# Patient Record
Sex: Female | Born: 1935 | Race: White | Hispanic: No | Smoking: Former smoker
Health system: Southern US, Community
[De-identification: ages and names within clinical notes are randomized; demographics above are authoritative.]

## PROBLEM LIST (undated history)

## (undated) DIAGNOSIS — G2581 Restless legs syndrome: Secondary | ICD-10-CM

## (undated) DIAGNOSIS — H3530 Unspecified macular degeneration: Secondary | ICD-10-CM

## (undated) DIAGNOSIS — E079 Disorder of thyroid, unspecified: Secondary | ICD-10-CM

## (undated) HISTORY — PX: APPENDECTOMY: SHX54

## (undated) HISTORY — PX: TONSILLECTOMY: SUR1361

## (undated) HISTORY — PX: ABDOMINAL HYSTERECTOMY: SHX81

## (undated) HISTORY — PX: THYROIDECTOMY: SHX17

---

## 2001-08-20 ENCOUNTER — Inpatient Hospital Stay (HOSPITAL_COMMUNITY): Admission: RE | Admit: 2001-08-20 | Discharge: 2001-08-21 | Payer: Self-pay | Admitting: Neurological Surgery

## 2001-08-20 ENCOUNTER — Encounter: Payer: Self-pay | Admitting: Neurological Surgery

## 2004-05-14 ENCOUNTER — Ambulatory Visit: Payer: Self-pay | Admitting: Ophthalmology

## 2004-08-27 ENCOUNTER — Ambulatory Visit: Payer: Self-pay | Admitting: Urology

## 2004-09-05 ENCOUNTER — Ambulatory Visit: Payer: Self-pay | Admitting: Urology

## 2004-09-13 ENCOUNTER — Ambulatory Visit: Payer: Self-pay | Admitting: Urology

## 2004-10-02 ENCOUNTER — Ambulatory Visit: Payer: Self-pay | Admitting: Internal Medicine

## 2005-10-24 ENCOUNTER — Ambulatory Visit: Payer: Self-pay | Admitting: Internal Medicine

## 2006-12-16 ENCOUNTER — Ambulatory Visit: Payer: Self-pay | Admitting: Internal Medicine

## 2007-03-18 ENCOUNTER — Ambulatory Visit: Payer: Self-pay | Admitting: Ophthalmology

## 2007-12-17 ENCOUNTER — Ambulatory Visit: Payer: Self-pay | Admitting: Internal Medicine

## 2008-06-22 ENCOUNTER — Ambulatory Visit: Payer: Self-pay | Admitting: Internal Medicine

## 2008-07-21 ENCOUNTER — Ambulatory Visit: Payer: Self-pay | Admitting: Urology

## 2008-07-22 ENCOUNTER — Ambulatory Visit: Payer: Self-pay | Admitting: Urology

## 2008-07-27 ENCOUNTER — Ambulatory Visit: Payer: Self-pay | Admitting: Urology

## 2008-08-01 ENCOUNTER — Ambulatory Visit: Payer: Self-pay | Admitting: Urology

## 2008-08-02 ENCOUNTER — Inpatient Hospital Stay: Payer: Self-pay | Admitting: Internal Medicine

## 2009-03-22 IMAGING — CT CT ABD-PELV W/O CM
1 of 2 series · 14 of 32 positions shown, 18 images · non-contrast
Comparison: none

REASON FOR EXAM: Nephrolithiasis
COMMENTS:

[Series 2: soft tissue · axial · 0.61mm/px · z∈[-0,+354]mm · 14 of 133 slices shown, 18 images]
[im 10/133  soft-tissue]
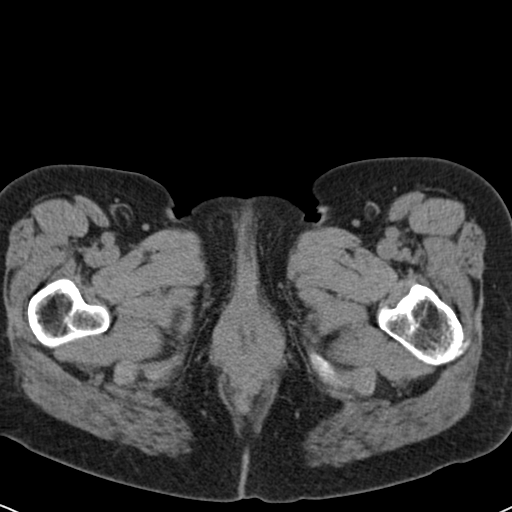
[im 10/133  bone]
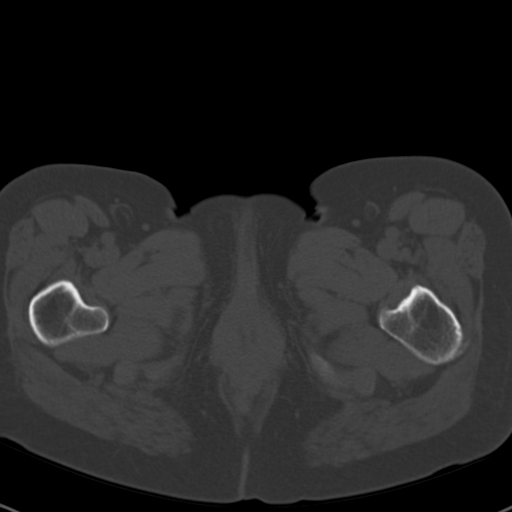
[im 20/133  soft-tissue]
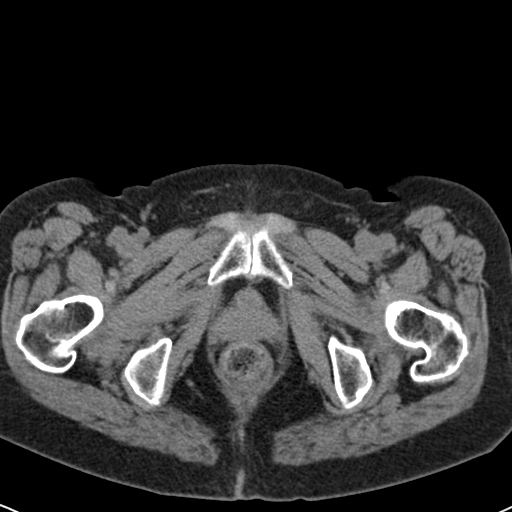
[im 30/133  soft-tissue]
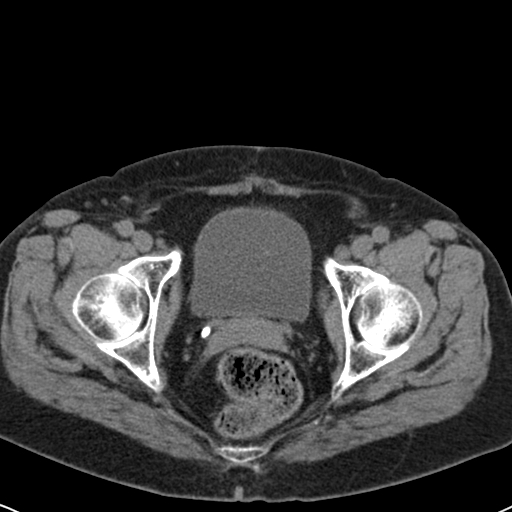
[im 40/133  soft-tissue]
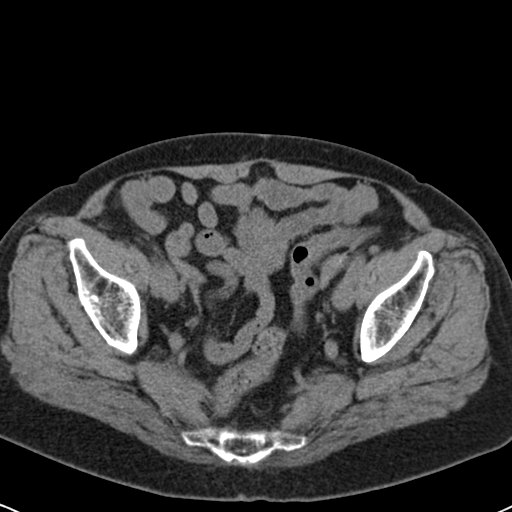
[im 49/133  soft-tissue]
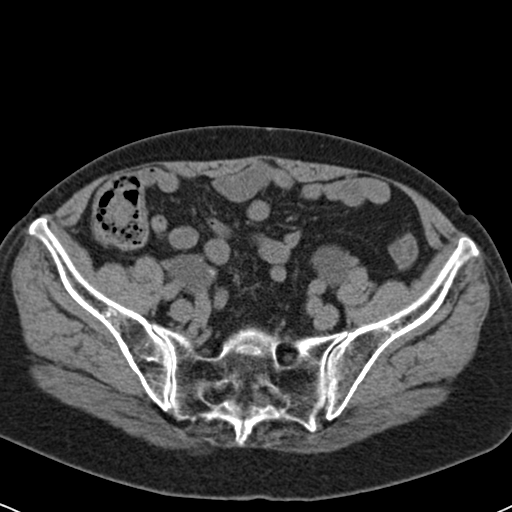
[im 59/133  soft-tissue]
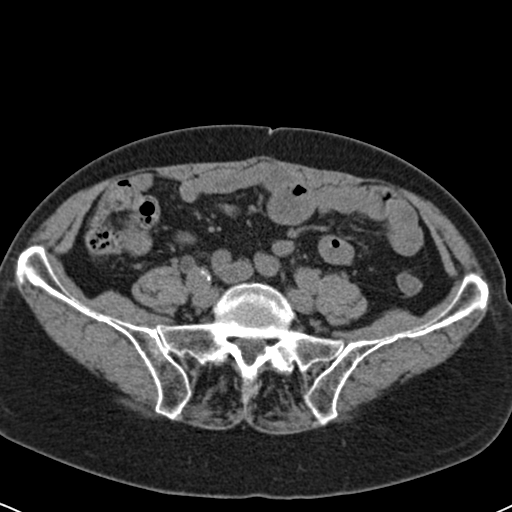
[im 74/133  soft-tissue]
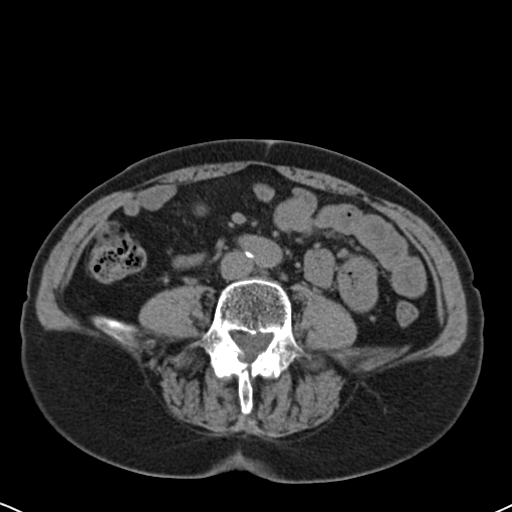
[im 84/133  soft-tissue]
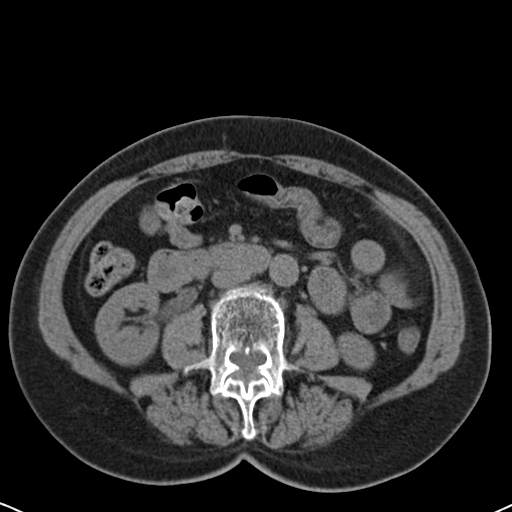
[im 93/133  soft-tissue]
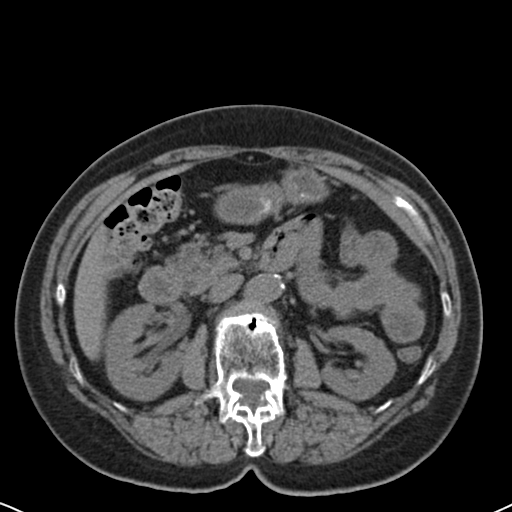
[im 93/133  bone]
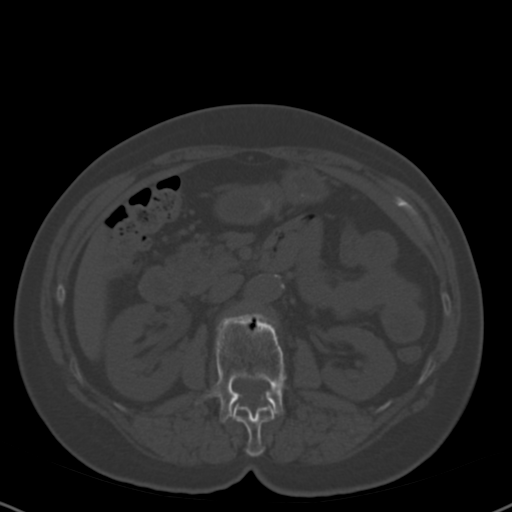
[im 103/133  soft-tissue]
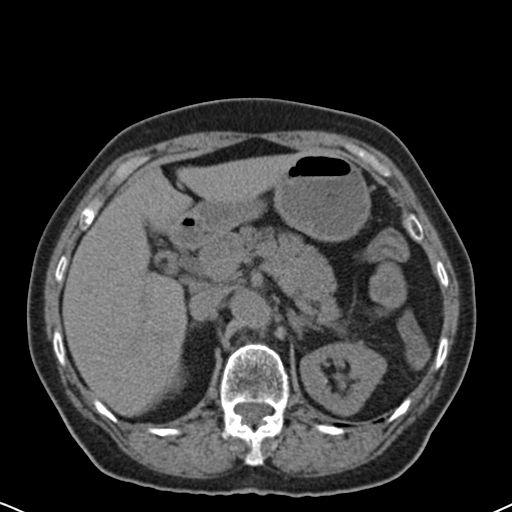
[im 113/133  soft-tissue]
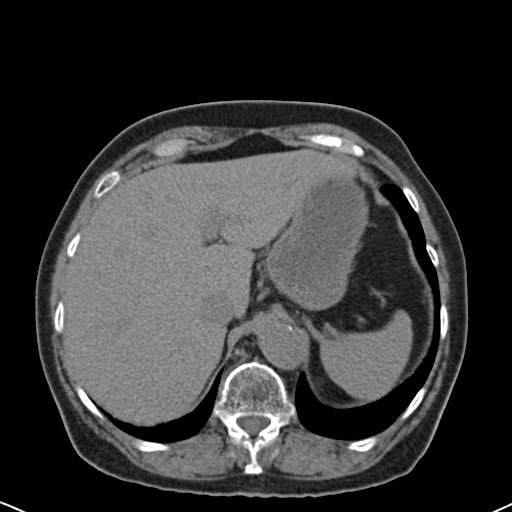
[im 113/133  lung]
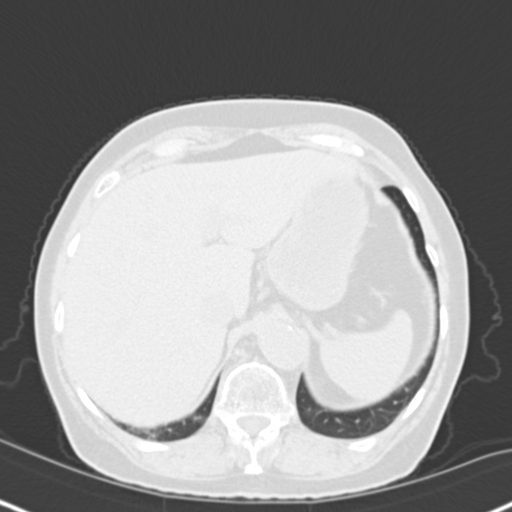
[im 118/133  lung]
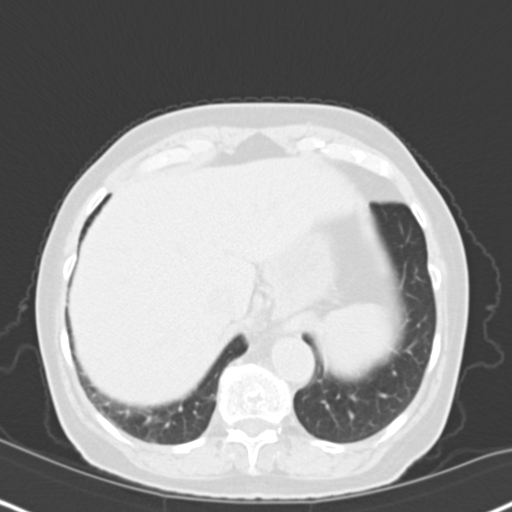
[im 123/133  soft-tissue]
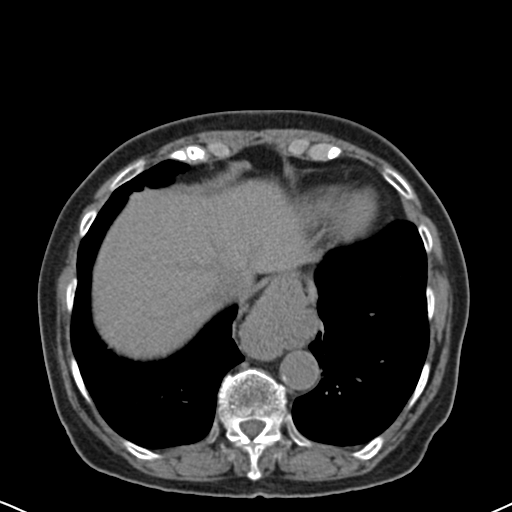
[im 123/133  lung]
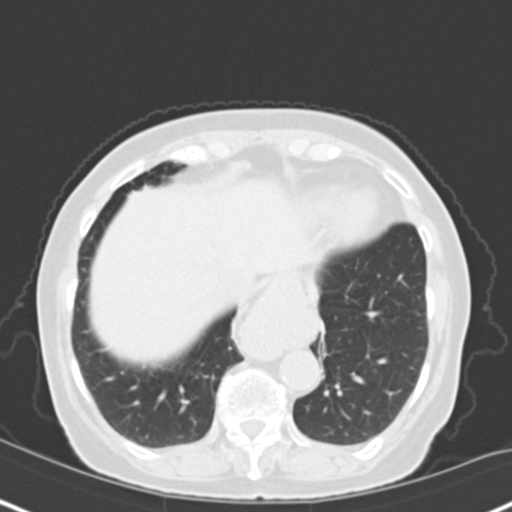
[im 128/133  lung]
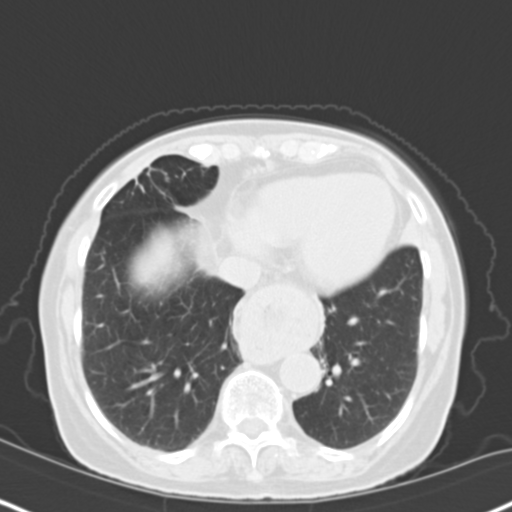

[14 of 32 positions shown; findings below may reference images not displayed]

PROCEDURE:     CT  - CT ABDOMEN AND PELVIS W[DATE]  [DATE]

RESULT:     A noncontrast CT scan was performed through the abdomen and
pelvis at 3 mm intervals and slice thicknesses in the axial plane. Soft
tissue and lung windows were photographed. Review of three dimensional
reconstructed images was performed separately on the [employer] server
monitor.

On the right, there is mild hydronephrosis and hydroureter secondary to a
stone that appears poised to fall into the urinary bladder lumen. This stone
measures approximately 3.0 x 5.0 mm and is demonstrated on image 107. There
is a non-obstructing approximately 2 mm in diameter midpole caliceal stone
on the right. On the left there is a non-obstructing, lower pole stone
measuring approximately 2.0 mm in diameter. There is no hydronephrosis on
the left. The urinary bladder exhibits no other stones. The uterus is
surgically absent. The sigmoid colon is normal in appearance. There are
cystic adnexal processes which are little changed in appearance since the CT
scan dated seen 08/27/2004. There has been interval growth of both cystic
structures such that the left cystic structure measures 3.4 x 3.2 cm and the
right cystic structure measures 2.7 x 3.5 cm. There is no inflammatory
change in the surrounding fat.

The liver, gallbladder, partially distended stomach, spleen, pancreas and
adrenal glands are normal in appearance. A portion of the stomach is
intrathoracic. The caliber of the abdominal aorta exhibits no evidence of
aneurysm. There is mild prominence of the caliber of the common iliac artery
on the right which measures approximately 1.6 cm in diameter. There is no
evidence of ascites. The unopacified loops of small and large bowel exhibit
no acute abnormality.
IMPRESSION: 1.  On the right there is mild hydronephrosis and hydroureter secondary to a
3.0 mm x 5.0 mm diameter stone that is poised to fall into the urinary
bladder lumen at the tip of the ureterovesical junction. There are other
non-obstructing stones measuring no more than 2.0 mm in diameter in both
kidneys.
2.  The hepatobiliary tree reveals no acute abnormality.
3.  There is partially intrathoracic stomach present.
4.  There are hypodensities in the adnexal regions bilaterally consistent
with cystic ovarian processes. These have increased in size since the prior
study. Pelvic Ultrasound would be a useful next step.

## 2009-06-27 ENCOUNTER — Ambulatory Visit: Payer: Self-pay | Admitting: Internal Medicine

## 2013-09-10 ENCOUNTER — Ambulatory Visit: Payer: Self-pay | Admitting: Gastroenterology

## 2013-10-12 ENCOUNTER — Ambulatory Visit: Payer: Self-pay | Admitting: Gastroenterology

## 2013-10-15 LAB — PATHOLOGY REPORT

## 2014-02-04 DIAGNOSIS — M5136 Other intervertebral disc degeneration, lumbar region: Secondary | ICD-10-CM | POA: Insufficient documentation

## 2014-02-04 DIAGNOSIS — M5416 Radiculopathy, lumbar region: Secondary | ICD-10-CM | POA: Insufficient documentation

## 2014-02-04 DIAGNOSIS — M51369 Other intervertebral disc degeneration, lumbar region without mention of lumbar back pain or lower extremity pain: Secondary | ICD-10-CM | POA: Insufficient documentation

## 2014-02-04 DIAGNOSIS — M47816 Spondylosis without myelopathy or radiculopathy, lumbar region: Secondary | ICD-10-CM | POA: Insufficient documentation

## 2014-02-05 DIAGNOSIS — M810 Age-related osteoporosis without current pathological fracture: Secondary | ICD-10-CM | POA: Insufficient documentation

## 2014-02-05 DIAGNOSIS — Z8659 Personal history of other mental and behavioral disorders: Secondary | ICD-10-CM | POA: Insufficient documentation

## 2014-02-05 DIAGNOSIS — M159 Polyosteoarthritis, unspecified: Secondary | ICD-10-CM | POA: Insufficient documentation

## 2014-02-05 DIAGNOSIS — G2581 Restless legs syndrome: Secondary | ICD-10-CM | POA: Insufficient documentation

## 2014-02-05 DIAGNOSIS — G4701 Insomnia due to medical condition: Secondary | ICD-10-CM | POA: Insufficient documentation

## 2014-02-05 DIAGNOSIS — G8929 Other chronic pain: Secondary | ICD-10-CM | POA: Insufficient documentation

## 2014-02-05 DIAGNOSIS — E039 Hypothyroidism, unspecified: Secondary | ICD-10-CM | POA: Insufficient documentation

## 2014-02-16 DIAGNOSIS — E7800 Pure hypercholesterolemia, unspecified: Secondary | ICD-10-CM | POA: Insufficient documentation

## 2016-01-25 DIAGNOSIS — I700 Atherosclerosis of aorta: Secondary | ICD-10-CM | POA: Insufficient documentation

## 2016-03-12 DIAGNOSIS — I5032 Chronic diastolic (congestive) heart failure: Secondary | ICD-10-CM | POA: Insufficient documentation

## 2016-04-11 ENCOUNTER — Encounter: Payer: Self-pay | Admitting: Emergency Medicine

## 2016-04-11 ENCOUNTER — Emergency Department
Admission: EM | Admit: 2016-04-11 | Discharge: 2016-04-11 | Disposition: A | Discharge status: Home / Self Care | Payer: Medicare Other | Attending: Emergency Medicine | Admitting: Emergency Medicine

## 2016-04-11 ENCOUNTER — Emergency Department: Payer: Medicare Other

## 2016-04-11 DIAGNOSIS — R42 Dizziness and giddiness: Secondary | ICD-10-CM

## 2016-04-11 DIAGNOSIS — Z79899 Other long term (current) drug therapy: Secondary | ICD-10-CM | POA: Diagnosis not present

## 2016-04-11 DIAGNOSIS — N390 Urinary tract infection, site not specified: Secondary | ICD-10-CM | POA: Insufficient documentation

## 2016-04-11 DIAGNOSIS — E876 Hypokalemia: Secondary | ICD-10-CM

## 2016-04-11 DIAGNOSIS — Z87891 Personal history of nicotine dependence: Secondary | ICD-10-CM | POA: Insufficient documentation

## 2016-04-11 DIAGNOSIS — R110 Nausea: Secondary | ICD-10-CM

## 2016-04-11 HISTORY — DX: Unspecified macular degeneration: H35.30

## 2016-04-11 HISTORY — DX: Disorder of thyroid, unspecified: E07.9

## 2016-04-11 HISTORY — DX: Restless legs syndrome: G25.81

## 2016-04-11 LAB — CBC
HEMATOCRIT: 37 % (ref 35.0–47.0)
HEMOGLOBIN: 12.2 g/dL (ref 12.0–16.0)
MCH: 26.9 pg (ref 26.0–34.0)
MCHC: 33.1 g/dL (ref 32.0–36.0)
MCV: 81.4 fL (ref 80.0–100.0)
Platelets: 290 10*3/uL (ref 150–440)
RBC: 4.55 MIL/uL (ref 3.80–5.20)
RDW: 15.7 % — AB (ref 11.5–14.5)
WBC: 7.8 10*3/uL (ref 3.6–11.0)

## 2016-04-11 LAB — BASIC METABOLIC PANEL
ANION GAP: 10 (ref 5–15)
BUN: 9 mg/dL (ref 6–20)
CHLORIDE: 103 mmol/L (ref 101–111)
CO2: 26 mmol/L (ref 22–32)
Calcium: 9.1 mg/dL (ref 8.9–10.3)
Creatinine, Ser: 0.79 mg/dL (ref 0.44–1.00)
GFR calc Af Amer: >60 mL/min (ref >60)
GFR calc non Af Amer: >60 mL/min (ref >60)
GLUCOSE: 107 mg/dL — AB (ref 65–99)
POTASSIUM: 3.4 mmol/L — AB (ref 3.5–5.1)
Sodium: 139 mmol/L (ref 135–145)

## 2016-04-11 LAB — URINALYSIS COMPLETE WITH MICROSCOPIC (ARMC ONLY)
BILIRUBIN URINE: NEGATIVE
GLUCOSE, UA: NEGATIVE mg/dL
HGB URINE DIPSTICK: NEGATIVE
Ketones, ur: NEGATIVE mg/dL
NITRITE: NEGATIVE
Protein, ur: NEGATIVE mg/dL
Specific Gravity, Urine: 1.008 (ref 1.005–1.030)
pH: 7 (ref 5.0–8.0)

## 2016-04-11 LAB — TROPONIN I

## 2016-04-11 MED ORDER — SULFAMETHOXAZOLE-TRIMETHOPRIM 800-160 MG PO TABS: 1.0000 | ORAL_TABLET | Freq: Two times a day (BID) | ORAL | 0 refills | Status: DC

## 2016-04-11 MED ORDER — IOPAMIDOL (ISOVUE-370) INJECTION 76%
75.0000 mL | Freq: Once | INTRAVENOUS | Status: AC | PRN
  Administered 2016-04-11: 75 mL via INTRAVENOUS

## 2016-04-11 MED ORDER — SULFAMETHOXAZOLE-TRIMETHOPRIM 800-160 MG PO TABS
1.0000 | ORAL_TABLET | Freq: Once | ORAL | Status: AC
  Administered 2016-04-11: 1 via ORAL
  Filled 2016-04-11: qty 1

## 2016-04-11 MED ORDER — POTASSIUM CHLORIDE CRYS ER 20 MEQ PO TBCR
40.0000 meq | EXTENDED_RELEASE_TABLET | Freq: Once | ORAL | Status: AC
  Administered 2016-04-11: 40 meq via ORAL
  Filled 2016-04-11: qty 2

## 2016-04-11 NOTE — Discharge Instructions (Signed)
Please stop taking: Tramadol, Lorazepam, and Torsemide.  Make an appointment with her primary care physician to discuss medication management of your ankle swelling and restless leg syndrome. Please discuss the side effects that you have been having with your doctor.  Return to the emergency department if he develops severe pain, headache, visual changes, numbness tingling or weakness, lightheadedness or fainting, chest pain, shortness of breath, palpitations, or any other symptoms concerning to you.

## 2016-04-11 NOTE — ED Notes (Signed)
Patient transported to CT 

## 2016-04-11 NOTE — ED Triage Notes (Signed)
Pt c/o dizziness and feeling like going to pass out for last 5 weeks since dr started new medications. Put her on lorazepam and a diuretic. Has had some SHOB as well. Denies pain.

## 2016-04-11 NOTE — ED Provider Notes (Signed)
[facility] Emergency Department Provider Note  ____________________________________________  Time seen: Approximately 7:30 PM  I have reviewed the triage vital signs and the nursing notes.   HISTORY  Chief Complaint Dizziness    HPI Julie Rangel is a 82 y.o. female w/ a hx of macular degeneration and restless leg syndrome presenting w/ lightheadedness, imbalance with walking, and nausea without vomiting. The patient reports that several weeks ago she was having bilateral lower extremity swelling, and was started on Lasix. She did not tolerate this medication, and was switched to torsemide. At the same time, the patient was having significant difficulty sleeping and during the day due to restless leg syndrome, and was placed on lorazepam 0.5mg  TID in addition to her chronic tramadol.Since then, the patient notices that when she stands she becomes lightheaded, and sometimes has difficulty walking due to imbalance. In addition she has nausea without vomiting. She was noted by her family to have increased sleepiness, sleeping throughout the day, as well as occasional slurred speech.  No headache, visual changes, hallucinations, nausea vomiting or diarrhea, numbness tingling or weakness.  Positive urinary "hotness" today only. She has been evaluated by her PCP for these symptoms, and reports a unremarkable echocardiogram for age, as well as reassuring blood work and negative UA x 2.   Past Medical History:  Diagnosis Date  . Macular degeneration   . RLS (restless legs syndrome)   . Thyroid disease     There are no active problems to display for this patient.   Past Surgical History:  Procedure Laterality Date  . ABDOMINAL HYSTERECTOMY    . APPENDECTOMY    . CESAREAN SECTION    . THYROIDECTOMY    . TONSILLECTOMY        Allergies Penicillins  History reviewed. No pertinent family history.  Social History Social History  Substance Use Topics  .  Smoking status: Former Games developer  . Smokeless tobacco: Never Used  . Alcohol use No    Review of Systems Constitutional: No fever/chills.Positive lightheadedness. No syncope. No diaphoresis. No trauma. Positive polypharmacy. Eyes: No visual changes. No blurred or double vision. ENT: No sore throat. No congestion or rhinorrhea. Cardiovascular: Denies chest pain. Denies palpitations. Respiratory: Denies shortness of breath.  No cough. Gastrointestinal: No abdominal pain.  No nausea, no vomiting.  No diarrhea.  No constipation. Genitourinary: Negative for dysuria. Musculoskeletal: Negative for back pain. Skin: Negative for rash. Neurological: Negative for headaches. No focal numbness, tingling or weakness. + Imbalance with walking.  10-point ROS otherwise negative.  ____________________________________________   PHYSICAL EXAM:  VITAL SIGNS: ED Triage Vitals  Enc Vitals Group     BP 04/11/16 1647 112/81     Pulse Rate 04/11/16 1647 64     Resp 04/11/16 1647 17     Temp 04/11/16 1647 97.9 F (36.6 C)     Temp Source 04/11/16 1647 Oral     SpO2 04/11/16 1647 99 %     Weight 04/11/16 1648 120 lb (54.4 kg)     Height 04/11/16 1648 5\' 3"  (1.6 m)     Head Circumference --      Peak Flow --      Pain Score --      Pain Loc --      Pain Edu? --      Excl. in GC? --     Constitutional: Alert and oriented. Well appearing and in no acute distress. Answers questions appropriately. Eyes: Conjunctivae are normal.  EOMI. No  scleral icterus. Head: Atraumatic. Nose: No congestion/rhinnorhea. Mouth/Throat: Mucous membranes are moist.  Neck: No stridor.  Supple.  No JVD. Cardiovascular: Normal rate, regular rhythm. No murmurs, rubs or gallops.  Respiratory: Normal respiratory effort.  No accessory muscle use or retractions. Lungs CTAB.  No wheezes, rales or ronchi. Gastrointestinal: Soft, nontender and nondistended.  No guarding or rebound.  No peritoneal signs. Musculoskeletal: No LE  edema. No ttp in the calves or palpable cords.  Negative Homan's sign. Neurologic: Alert and oriented 3. Speech is clear. Naming and repetition are intact. Face and smile symmetric. Tongue is midline. EOMI and PERRLA.  No pronator drift. 5 out of 5 grip, biceps, triceps, hip flexors, plantar flexion and dorsiflexion. Normal sensation to light touch in the bilateral upper and lower extremities, and face. Normal heel-to-shin without ataxia.  Skin:  Skin is warm, dry and intact. No rash noted. Psychiatric: Mood and affect are normal. Speech and behavior are normal.  Normal judgement  ____________________________________________   LABS (all labs ordered are listed, but only abnormal results are displayed)  Labs Reviewed  BASIC METABOLIC PANEL - Abnormal; Notable for the following:       Result Value   Potassium 3.4 (*)    Glucose, Bld 107 (*)    All other components within normal limits  CBC - Abnormal; Notable for the following:    RDW 15.7 (*)    All other components within normal limits  URINALYSIS COMPLETEWITH MICROSCOPIC ([facility] ONLY) - Abnormal; Notable for the following:    Color, Urine YELLOW (*)    APPearance CLEAR (*)    Leukocytes, UA 1+ (*)    Bacteria, UA RARE (*)    Squamous Epithelial / LPF 0-5 (*)    All other components within normal limits  URINE CULTURE  TROPONIN I  CBG MONITORING, ED   ____________________________________________  EKG  ED ECG REPORT I, Rockne MenghiniNorman, Anne-Caroline, the attending physician, personally viewed and interpreted this ECG.   Date: 04/11/2016  EKG Time: 1700  Rate: 65  Rhythm: normal sinus rhythm  Axis: Normal  Intervals:none  ST&T Change: Nonspecific T-wave inversion in V1. No ST elevation.  ____________________________________________  RADIOLOGY  Ct Angio Head W Or Wo Contrast  Result Date: 04/11/2016 CLINICAL DATA:  82 y/o F; dizziness and feeling like they are going to pass out for 5 weeks and starting a new medication. EXAM:  CT ANGIOGRAPHY HEAD AND NECK TECHNIQUE: Multidetector CT imaging of the head and neck was performed using the standard protocol during bolus administration of intravenous contrast. Multiplanar CT image reconstructions and MIPs were obtained to evaluate the vascular anatomy. Carotid stenosis measurements (when applicable) are obtained utilizing NASCET criteria, using the distal internal carotid diameter as the denominator. CONTRAST:  75 cc Isovue 370. COMPARISON:  None. FINDINGS: CT HEAD Brain: No evidence of large acute infarct, focal mass effect, intracranial hemorrhage, or hydrocephalus. Mild chronic microvascular ischemic changes and parenchymal volume loss for age. Moderate calcific atherosclerosis of cavernous internal carotid arteries. No hyperdense vessel identified. Calvarium and skull base: Negative. Paranasal sinuses: Normally aerated. Orbits: Unremarkable. CTA NECK Aortic arch: Mild calcific atherosclerosis of the arch. Right carotid system: Mild calcifications at the bifurcation. Brief retropharyngeal submucosal course of proximal internal carotid artery. No occlusion, aneurysm, dissection, or significant stenosis is identified. Left carotid system: Mild calcifications of the bifurcation. No occlusion, aneurysm, dissection, or significant stenosis is identified. Vertebral arteries:No occlusion, aneurysm, dissection, or significant stenosis is identified. Left dominant vertebrobasilar system. Skeleton: Straightening of cervical lordosis. Discogenic  degenerative changes greatest at Celsius 4 through C6 with there is disc space narrowing and marginal osteophytes. No high-grade bony canal stenosis or foraminal narrowing is identified. Other neck: No appreciable thyroid gland, possible post resection or ablation. No lymphadenopathy or discrete cervical mass in the neck is identified. Inflammatory calcifications of left palatini tonsil. Aerodigestive tract is patent. Upper chest: Moderate to severe  centrilobular predominant emphysema in the lung apices. 4 mm right upper lobe nodule (series 4, image 8). CTA HEAD Anterior circulation: Mild mixed plaque of the carotid siphons bilaterally without significant stenosis. 2 mm inferiorly and medially directed aneurysm of the left cavernous segment (series 6, image 102 and series 8, image 121). Bilateral M1 and the 2 are patent and distal circulations symmetric. Small anterior communicating artery. Posterior circulation: Bilateral vertebral arteries, bilateral PICA, bilateral AICA, bilateral SCA, bilateral PCA, and the basilar artery are patent. No occlusion, aneurysm, dissection, or significant stenosis is identified. Venous sinuses: Right transverse sinus prominent arachnoid granulation. No dural venous sinus thrombosis identified. Anatomic variants: Small right posterior communicating artery. No left posterior communicating artery identified, likely hypoplastic or absent. Delayed phase: No abnormal enhancement of the brain parenchyma is identified. IMPRESSION: 1. No acute intracranial abnormality or abnormal enhancement is identified. 2. Carotid and vertebral arteries of the neck are patent. No occlusion, aneurysm, dissection, or significant stenosis is identified. 3. Patent circle of Willis. No occlusion, dissection, or significant stenosis is identified. 4. 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 5. Moderate to severe lung apex emphysema. 6. Right upper lobe 4 mm pulmonary nodule. No follow-up needed if patient is low-risk. Non-contrast chest CT can be considered in 12 months if patient is high-risk. This recommendation follows the consensus statement: Guidelines for Management of Incidental Pulmonary Nodules Detected on CT Images:From the Fleischner Society 2017; published online before print (10.1148/[id number]). Electronically Signed   By: Mitzi Hansen M.D.   On: 04/11/2016 20:34   Dg Chest 2 View  Result  Date: 04/11/2016 CLINICAL DATA:  Feeling unwell for 5 weeks, now worsening. History of bronchitis, former smoker. EXAM: CHEST  2 VIEW COMPARISON:  Chest radiograph September 10, 2013 FINDINGS: Cardiomediastinal silhouette is normal. Mildly calcified aortic knob. Increased lung volumes with mild chronic interstitial changes. No pleural effusion or focal consolidation. No pneumothorax. Moderate hiatal hernia. Osteopenia. Soft tissue planes are nonsuspicious. IMPRESSION: COPD, no acute cardiopulmonary process. Moderate hiatal hernia. Electronically Signed   By: Awilda Metro M.D.   On: 04/11/2016 18:01   Ct Angio Neck W And/or Wo Contrast  Result Date: 04/11/2016 CLINICAL DATA:  82 y/o F; dizziness and feeling like they are going to pass out for 5 weeks and starting a new medication. EXAM: CT ANGIOGRAPHY HEAD AND NECK TECHNIQUE: Multidetector CT imaging of the head and neck was performed using the standard protocol during bolus administration of intravenous contrast. Multiplanar CT image reconstructions and MIPs were obtained to evaluate the vascular anatomy. Carotid stenosis measurements (when applicable) are obtained utilizing NASCET criteria, using the distal internal carotid diameter as the denominator. CONTRAST:  75 cc Isovue 370. COMPARISON:  None. FINDINGS: CT HEAD Brain: No evidence of large acute infarct, focal mass effect, intracranial hemorrhage, or hydrocephalus. Mild chronic microvascular ischemic changes and parenchymal volume loss for age. Moderate calcific atherosclerosis of cavernous internal carotid arteries. No hyperdense vessel identified. Calvarium and skull base: Negative. Paranasal sinuses: Normally aerated. Orbits: Unremarkable. CTA NECK Aortic arch: Mild calcific atherosclerosis of the arch. Right carotid system: Mild calcifications at  the bifurcation. Brief retropharyngeal submucosal course of proximal internal carotid artery. No occlusion, aneurysm, dissection, or significant stenosis  is identified. Left carotid system: Mild calcifications of the bifurcation. No occlusion, aneurysm, dissection, or significant stenosis is identified. Vertebral arteries:No occlusion, aneurysm, dissection, or significant stenosis is identified. Left dominant vertebrobasilar system. Skeleton: Straightening of cervical lordosis. Discogenic degenerative changes greatest at Celsius 4 through C6 with there is disc space narrowing and marginal osteophytes. No high-grade bony canal stenosis or foraminal narrowing is identified. Other neck: No appreciable thyroid gland, possible post resection or ablation. No lymphadenopathy or discrete cervical mass in the neck is identified. Inflammatory calcifications of left palatini tonsil. Aerodigestive tract is patent. Upper chest: Moderate to severe centrilobular predominant emphysema in the lung apices. 4 mm right upper lobe nodule (series 4, image 8). CTA HEAD Anterior circulation: Mild mixed plaque of the carotid siphons bilaterally without significant stenosis. 2 mm inferiorly and medially directed aneurysm of the left cavernous segment (series 6, image 102 and series 8, image 121). Bilateral M1 and the 2 are patent and distal circulations symmetric. Small anterior communicating artery. Posterior circulation: Bilateral vertebral arteries, bilateral PICA, bilateral AICA, bilateral SCA, bilateral PCA, and the basilar artery are patent. No occlusion, aneurysm, dissection, or significant stenosis is identified. Venous sinuses: Right transverse sinus prominent arachnoid granulation. No dural venous sinus thrombosis identified. Anatomic variants: Small right posterior communicating artery. No left posterior communicating artery identified, likely hypoplastic or absent. Delayed phase: No abnormal enhancement of the brain parenchyma is identified. IMPRESSION: 1. No acute intracranial abnormality or abnormal enhancement is identified. 2. Carotid and vertebral arteries of the neck are  patent. No occlusion, aneurysm, dissection, or significant stenosis is identified. 3. Patent circle of Willis. No occlusion, dissection, or significant stenosis is identified. 4. 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 5. Moderate to severe lung apex emphysema. 6. Right upper lobe 4 mm pulmonary nodule. No follow-up needed if patient is low-risk. Non-contrast chest CT can be considered in 12 months if patient is high-risk. This recommendation follows the consensus statement: Guidelines for Management of Incidental Pulmonary Nodules Detected on CT Images:From the Fleischner Society 2017; published online before print (10.1148/[id number]). Electronically Signed   By: Mitzi Hansen M.D.   On: 04/11/2016 20:34    ____________________________________________   PROCEDURES  Procedure(s) performed: None  Procedures  Critical Care performed: No ____________________________________________   INITIAL IMPRESSION / ASSESSMENT AND PLAN / ED COURSE  Pertinent labs & imaging results that were available during my care of the patient were reviewed by me and considered in my medical decision making (see chart for details).  82 y.o. female with recent addition of torsemide, lorazepam and tramadol to her medications presenting with postural lightheadedness, imbalance with walking, nausea without vomiting. Overall, the patient is well-appearing with stable vital signs and normal orthostatics. She has no focal neurologic deficits on exam. She has no abnormal cardio home findings on examination. It is possible that her symptoms are due to polypharmacy. Her urinalysis does show have some bacteria with leukocyte esterase, so we'll treat her for UTI although it is unlikely that her symptoms are solely due to a urinary tract infection. A culture has been sent. The patient's not anemic, and her electrolytes are reassuring. I will get a CT and CT angiogram of the head and  neck to rule out posterior stroke, but if this is negative, I'll plan to discharge the patient home with instructions to follow up with her primary care  physician for medication reconciliation, stopping the Lorazepam.  ----------------------------------------- 9:29 PM on 04/11/2016 -----------------------------------------  The patient's CT angio of the head and neck and CT scan are negative for any acute processes. The patient is feeling better and will be discharge home. Precautions as well as follow-up instructions were discussed. ____________________________________________  FINAL CLINICAL IMPRESSION(S) / ED DIAGNOSES  Final diagnoses:  Hypokalemia  Lightheadedness  Nausea without vomiting  Polypharmacy  UTI (lower urinary tract infection)    Clinical Course      NEW MEDICATIONS STARTED DURING THIS VISIT:  There are no discharge medications for this patient.     Rockne Menghini, MD 04/11/16 2129

## 2016-04-13 LAB — URINE CULTURE: Culture: NO GROWTH

## 2016-05-23 ENCOUNTER — Encounter: Payer: Self-pay | Admitting: Pain Medicine

## 2016-05-23 ENCOUNTER — Ambulatory Visit
Admission: RE | Admit: 2016-05-23 | Discharge: 2016-05-23 | Disposition: A | Discharge status: Home / Self Care | Payer: Medicare Other | Source: Ambulatory Visit | Attending: Pain Medicine | Admitting: Pain Medicine

## 2016-05-23 ENCOUNTER — Other Ambulatory Visit
Admission: RE | Admit: 2016-05-23 | Discharge: 2016-05-23 | Disposition: A | Discharge status: Home / Self Care | Payer: Medicare Other | Source: Ambulatory Visit | Attending: Pain Medicine | Admitting: Pain Medicine

## 2016-05-23 ENCOUNTER — Ambulatory Visit: Payer: Medicare Other | Attending: Pain Medicine | Admitting: Pain Medicine

## 2016-05-23 VITALS — BP 156/75 | HR 74 | Temp 98.0°F | Ht 63.0 in | Wt 120.0 lb

## 2016-05-23 DIAGNOSIS — G8929 Other chronic pain: Secondary | ICD-10-CM

## 2016-05-23 DIAGNOSIS — Z880 Allergy status to penicillin: Secondary | ICD-10-CM | POA: Diagnosis not present

## 2016-05-23 DIAGNOSIS — M792 Neuralgia and neuritis, unspecified: Secondary | ICD-10-CM | POA: Insufficient documentation

## 2016-05-23 DIAGNOSIS — M4316 Spondylolisthesis, lumbar region: Secondary | ICD-10-CM | POA: Diagnosis not present

## 2016-05-23 DIAGNOSIS — M545 Low back pain: Secondary | ICD-10-CM | POA: Diagnosis not present

## 2016-05-23 DIAGNOSIS — M818 Other osteoporosis without current pathological fracture: Secondary | ICD-10-CM

## 2016-05-23 DIAGNOSIS — Z87440 Personal history of urinary (tract) infections: Secondary | ICD-10-CM | POA: Diagnosis not present

## 2016-05-23 DIAGNOSIS — M47894 Other spondylosis, thoracic region: Secondary | ICD-10-CM | POA: Insufficient documentation

## 2016-05-23 DIAGNOSIS — R1030 Lower abdominal pain, unspecified: Secondary | ICD-10-CM

## 2016-05-23 DIAGNOSIS — I5032 Chronic diastolic (congestive) heart failure: Secondary | ICD-10-CM

## 2016-05-23 DIAGNOSIS — Z87891 Personal history of nicotine dependence: Secondary | ICD-10-CM | POA: Insufficient documentation

## 2016-05-23 DIAGNOSIS — M5416 Radiculopathy, lumbar region: Secondary | ICD-10-CM

## 2016-05-23 DIAGNOSIS — H3530 Unspecified macular degeneration: Secondary | ICD-10-CM | POA: Insufficient documentation

## 2016-05-23 DIAGNOSIS — M961 Postlaminectomy syndrome, not elsewhere classified: Secondary | ICD-10-CM

## 2016-05-23 DIAGNOSIS — M810 Age-related osteoporosis without current pathological fracture: Secondary | ICD-10-CM | POA: Insufficient documentation

## 2016-05-23 DIAGNOSIS — E079 Disorder of thyroid, unspecified: Secondary | ICD-10-CM | POA: Insufficient documentation

## 2016-05-23 DIAGNOSIS — M79606 Pain in leg, unspecified: Secondary | ICD-10-CM

## 2016-05-23 DIAGNOSIS — M79604 Pain in right leg: Secondary | ICD-10-CM | POA: Diagnosis present

## 2016-05-23 DIAGNOSIS — Z888 Allergy status to other drugs, medicaments and biological substances status: Secondary | ICD-10-CM | POA: Diagnosis not present

## 2016-05-23 DIAGNOSIS — G2581 Restless legs syndrome: Secondary | ICD-10-CM | POA: Insufficient documentation

## 2016-05-23 DIAGNOSIS — M4726 Other spondylosis with radiculopathy, lumbar region: Secondary | ICD-10-CM | POA: Diagnosis not present

## 2016-05-23 DIAGNOSIS — M5136 Other intervertebral disc degeneration, lumbar region: Secondary | ICD-10-CM | POA: Insufficient documentation

## 2016-05-23 DIAGNOSIS — G4700 Insomnia, unspecified: Secondary | ICD-10-CM | POA: Diagnosis not present

## 2016-05-23 DIAGNOSIS — E039 Hypothyroidism, unspecified: Secondary | ICD-10-CM | POA: Insufficient documentation

## 2016-05-23 DIAGNOSIS — Z79899 Other long term (current) drug therapy: Secondary | ICD-10-CM | POA: Diagnosis not present

## 2016-05-23 DIAGNOSIS — R209 Unspecified disturbances of skin sensation: Secondary | ICD-10-CM

## 2016-05-23 DIAGNOSIS — Z79891 Long term (current) use of opiate analgesic: Secondary | ICD-10-CM | POA: Insufficient documentation

## 2016-05-23 DIAGNOSIS — M5116 Intervertebral disc disorders with radiculopathy, lumbar region: Secondary | ICD-10-CM | POA: Insufficient documentation

## 2016-05-23 DIAGNOSIS — E7800 Pure hypercholesterolemia, unspecified: Secondary | ICD-10-CM | POA: Diagnosis not present

## 2016-05-23 DIAGNOSIS — M79605 Pain in left leg: Secondary | ICD-10-CM

## 2016-05-23 DIAGNOSIS — Z0189 Encounter for other specified special examinations: Secondary | ICD-10-CM | POA: Insufficient documentation

## 2016-05-23 DIAGNOSIS — F1190 Opioid use, unspecified, uncomplicated: Secondary | ICD-10-CM | POA: Insufficient documentation

## 2016-05-23 LAB — BRAIN NATRIURETIC PEPTIDE: B NATRIURETIC PEPTIDE 5: 50 pg/mL (ref 0.0–100.0)

## 2016-05-23 LAB — COMPREHENSIVE METABOLIC PANEL
ALBUMIN: 3.9 g/dL (ref 3.5–5.0)
ALT: 15 U/L (ref 14–54)
ANION GAP: 11 (ref 5–15)
AST: 27 U/L (ref 15–41)
Alkaline Phosphatase: 99 U/L (ref 38–126)
BILIRUBIN TOTAL: 0.5 mg/dL (ref 0.3–1.2)
BUN: 16 mg/dL (ref 6–20)
CO2: 27 mmol/L (ref 22–32)
Calcium: 9.5 mg/dL (ref 8.9–10.3)
Chloride: 99 mmol/L — ABNORMAL LOW (ref 101–111)
Creatinine, Ser: 0.79 mg/dL (ref 0.44–1.00)
GFR calc Af Amer: >60 mL/min (ref >60)
GFR calc non Af Amer: >60 mL/min (ref >60)
GLUCOSE: 108 mg/dL — AB (ref 65–99)
POTASSIUM: 3.6 mmol/L (ref 3.5–5.1)
SODIUM: 137 mmol/L (ref 135–145)
TOTAL PROTEIN: 7.5 g/dL (ref 6.5–8.1)

## 2016-05-23 LAB — SEDIMENTATION RATE: SED RATE: 6 mm/h (ref 0–30)

## 2016-05-23 LAB — MAGNESIUM: Magnesium: 1.9 mg/dL (ref 1.7–2.4)

## 2016-05-23 LAB — VITAMIN B12: Vitamin B-12: 355 pg/mL (ref 180–914)

## 2016-05-23 NOTE — Progress Notes (Signed)
Safety precautions to be maintained throughout the outpatient stay will include: orient to surroundings, keep bed in low position, maintain call bell within reach at all times, provide assistance with transfer out of bed and ambulation.  

## 2016-05-23 NOTE — Progress Notes (Signed)
Patient's Name: Julie Rangel  MRN: [id number]  Referring Provider: Lauro Regulus, MD  DOB: 1922/01/30  PCP: Lauro Regulus., MD  DOS: 05/23/2016  Note by: Sydnee Levans. Laban Emperor, MD  Service setting: Ambulatory outpatient  Specialty: Interventional Pain Management  Location: [facility] (AMB) Pain Management Facility    Patient type: New Patient   Primary Reason(s) for Visit: Initial Patient Evaluation CC: Leg Pain (bilateral)  HPI  [name] is a 82 y.o. year old, female patient, who comes today for an initial evaluation. She has Hardening of the aorta (main artery of the heart) (HCC); Chronic diastolic CHF (congestive heart failure) (HCC); Degenerative disc disease, lumbar; Generalized OA; History of depression; Hypothyroid; Insomnia; Lumbar radiculitis; Lumbar spondylosis; Osteoporosis; Pure hypercholesterolemia; RLS (restless legs syndrome); Chronic pain; Failed back surgical syndrome; Chronic low back pain (Location of Secondary source of pain) (Bilateral) (L>R); Neurogenic pain; Chronic lower extremity pain (Location of Primary Source of Pain) (Bilateral) (L>R); Chronic groin pain (Bilateral) (L>R); Disturbance of skin sensation; Long term current use of opiate analgesic; Long term prescription opiate use; Opiate use; and Encounter for pain management planning on her problem list.. Her primarily concern today is the Leg Pain (bilateral)  Pain Assessment: Self-Reported Pain Score: 1 /10             Reported level is compatible with observation.       Pain Type: Chronic pain Pain Location: Leg (back) Pain Orientation: Left, Right (lower back) Pain Descriptors / Indicators: Burning (drawing) Pain Frequency: Intermittent  Onset and Duration: Gradual and Date of onset: 3 years ago Cause of pain: Restless leg syndrome Severity: Getting worse, NAS-11 at its worse: 10/10, NAS-11 at its best: 2/10, NAS-11 now: 3/10 and NAS-11 on the average: 6/10 Timing: Night Aggravating Factors:  Motion Alleviating Factors: Medications Associated Problems: Sweating, Swelling, Tingling, Pain that wakes patient up and Pain that does not allow patient to sleep Quality of Pain: Agonizing, Burning, Constant, Distressing, Dreadful, Getting longer, Hot, Itching, Shooting and Tingling Previous Examinations or Tests: X-rays Previous Treatments: Narcotic medications  The patient comes into the clinics today for the first time for a chronic pain management evaluation. According to the patient everything started approximately 27 years ago with restless leg syndrome. Several years ago she developed low back problems and lower extremity pain with weakness that was bilateral. She underwent back surgery in 2003 by Dr. Danielle Dess which completely eliminated this lower extremity pain and weakness. 2-4 years ago she started again having lower extremity pain approximately 2 years ago she was started on pain medications. She was seen by Dr. Yves Dill who put her on gabapentin. Unfortunately even though it did help her pain, it made her sedation very blurry and he was later found out that it was secondary to the effects of the medication on her macular degeneration. She experienced similar problems with the use of tramadol. They also attempted to treat it with lorazepam but this made her very loopy. Currently her primary symptoms are those of the lower extremity pain which is bilateral but with the left side being worst on the right. She has also been experiencing left groin electrical-like sensations that go down the leg primarily from the calf down to the bottom of her foot in what seems to be an S1 dermatomal distribution. In the case of her right lower extremity pain it follows a pattern identical to that of the left lower extremity. The patient's secondary pain is that of the lower back, which is also bilateral  with the left being worst on the right. She indicates having had some nerve blocks done by Dr. Yves Dill but these  are not available at this time. She was referred to our service by Dr. Dareen Piano. She indicates that taking hydrocodone/APAP 5/325 one every 6 hours seems to help her leg symptoms, especially when she combines it with the gabapentin. Unfortunately, they gabapentin does cause this blurred vision secondary to her macular degeneration.  Today I took the time to provide the patient with information regarding my pain practice. The patient was informed that my practice is divided into two sections: an interventional pain management section, as well as a completely separate and distinct medication management section. The interventional portion of my practice takes place on Tuesdays and Thursdays, while the medication management is conducted on Mondays and Wednesdays. Because of the amount of documentation required on both them, they are kept separated. This means that there is the possibility that the patient may be scheduled for a procedure on Tuesday, while also having a medication management appointment on Wednesday. I have also informed the patient that because of current staffing and facility limitations, I no longer take patients for medication management only. To illustrate the reasons for this, I gave the patient the example of a surgeon and how inappropriate it would be to refer a patient to his/her practice so that they write for the post-procedure antibiotics on a surgery done by someone else.   The patient was informed that joining my practice means that they are open to any and all interventional therapies. I clarified for the patient that this does not mean that they will be forced to have any procedures done. What it means is that patients looking for a practitioner to simply write for their pain medications and not take advantage of other interventional techniques will be better served by a different practitioner, other than myself. I made it clear that I prefer to spend my time providing those services  that I specialize in.  The patient was also made aware of my Comprehensive Pain Management Safety Guidelines where by joining my practice, they limit all of their nerve blocks and joint injections to those done by our practice, for as long as we are retained to manage their care.   Historic Controlled Substance Pharmacotherapy Review  Currently Prescribed Analgesic: Hydrocodone/APAP 5/325 one every 6 hours (20 mg/day of hydrocodone) MME/day: 20 mg/day Medications: The patient did not bring the medication(s) to the appointment, as requested in our "New Patient Package" Pharmacodynamics: Analgesic Effect: More than 50% Activity Facilitation: Medication(s) allow patient to sit, stand, walk, and do the basic ADLs Perceived Effectiveness: Described as relatively effective, allowing for increase in activities of daily living (ADL) Side-effects or Adverse reactions: None reported Historical Background Evaluation: [place] PDMP: Five (5) year initial data search conducted. No abnormal patterns identified [place] [employer] Offender Public Information: Non-contributory UDS Results: No UDS results available at this time UDS Interpretation: N/A Medication Assessment Form: Not applicable. Initial evaluation. The patient has not received any medications from our practice Treatment compliance: Not applicable. Initial evaluation Risk Assessment Profile: Aberrant/High Risk Behavior: None observed or detected today Risk Factors for Fatal Opioid Overdose: None identified today Fatal overdose hazard ratio (HR): Calculation deferred Non-fatal overdose hazard ratio (HR): Calculation deferred Substance Use Disorder (SUD) Risk Level: Pending results of Medical Psychology Evaluation for SUD Opioid Risk Tool (ORT) Score: 0   Low Risk for SUD (Score <3) Depression Scale Score: PHQ-2: 0  No depression (0) PHQ-9: 0   No depression (0-4)  Pharmacologic Plan: Pending ordered tests and/or  consults  Historical Illicit Drug Screen Labs(s): No results found for: MDMA, COCAINSCRNUR, PCPSCRNUR, THCU, ETH Meds  The patient has a current medication list which includes the following prescription(s): furosemide, gabapentin, hydrocodone-acetaminophen, levothyroxine, multiple vitamins-minerals, potassium chloride, premarin, senna, and torsemide.  No current outpatient prescriptions on file prior to visit.   No current facility-administered medications on file prior to visit.    Imaging Review   Note: No results found under the [facility] electronic medical record  ROS  Cardiovascular History: Negative for hypertension, coronary artery diseas, myocardial infraction, anticoagulant therapy or heart failure Pulmonary or Respiratory History: Shortness of breath Neurological History: Negative for epilepsy, stroke, urinary or fecal inontinence, spina bifida or tethered cord syndrome Review of Past Neurological Studies: No results found for this or any previous visit. Psychological-Psychiatric History: Negative for anxiety, depression, schizophrenia, bipolar disorders or suicidal ideations or attempts Gastrointestinal History: Negative for peptic ulcer disease, hiatal hernia, GERD, IBS, hepatitis, cirrhosis or pancreatitis Genitourinary History: Recurrent Urinary Tract infections Hematological History: Brusing easily Endocrine History: Hypothyroidism Rheumatologic History: Osteoarthritis Musculoskeletal History: Negative for myasthenia gravis, muscular dystrophy, multiple sclerosis or malignant hyperthermia Work History: Retired  Allergies  Ms. Lynn is allergic to lorazepam; penicillin g; penicillins; risedronate; and ropinirole.  Laboratory Chemistry  Inflammation Markers Lab Results  Component Value Date   ESRSEDRATE 6 05/23/2016   Renal Function Lab Results  Component Value Date   BUN 16 05/23/2016   CREATININE 0.79 05/23/2016   GFRAA >60 05/23/2016   GFRNONAA >60  05/23/2016   Hepatic Function Lab Results  Component Value Date   AST 27 05/23/2016   ALT 15 05/23/2016   ALBUMIN 3.9 05/23/2016   Electrolytes Lab Results  Component Value Date   NA 137 05/23/2016   K 3.6 05/23/2016   CL 99 (L) 05/23/2016   CALCIUM 9.5 05/23/2016   MG 1.9 05/23/2016   Pain Modulating Vitamins Lab Results  Component Value Date   25OHVITD1 35 05/23/2016   25OHVITD2 1.1 05/23/2016   25OHVITD3 34 05/23/2016   VITAMINB12 355 05/23/2016   Coagulation Parameters Lab Results  Component Value Date   PLT 290 04/11/2016   Cardiovascular Lab Results  Component Value Date   BNP 50.0 05/23/2016   HGB 12.2 04/11/2016   HCT 37.0 04/11/2016   Note: Lab results reviewed.  PFSH  Drug: Ms. Schoenbeck  reports that she does not use drugs. Alcohol:  reports that she does not drink alcohol. Tobacco:  reports that she has quit smoking. She has never used smokeless tobacco. Medical:  has a past medical history of Macular degeneration; RLS (restless legs syndrome); and Thyroid disease. Family: family history is not on file.  Past Surgical History:  Procedure Laterality Date  . ABDOMINAL HYSTERECTOMY    . APPENDECTOMY    . CESAREAN SECTION    . THYROIDECTOMY    . TONSILLECTOMY     Active Ambulatory Problems    Diagnosis Date Noted  . Hardening of the aorta (main artery of the heart) (HCC) 01/25/2016  . Chronic diastolic CHF (congestive heart failure) (HCC) 03/12/2016  . Degenerative disc disease, lumbar 02/04/2014  . Generalized OA 02/05/2014  . History of depression 02/05/2014  . Hypothyroid 02/05/2014  . Insomnia 02/05/2014  . Lumbar radiculitis 02/04/2014  . Lumbar spondylosis 02/04/2014  . Osteoporosis 02/05/2014  . Pure hypercholesterolemia 02/16/2014  . RLS (restless legs syndrome) 02/05/2014  . Chronic pain  05/23/2016  . Failed back surgical syndrome 05/23/2016  . Chronic low back pain (Location of Secondary source of pain) (Bilateral) (L>R)  05/23/2016  . Neurogenic pain 05/23/2016  . Chronic lower extremity pain (Location of Primary Source of Pain) (Bilateral) (L>R) 05/23/2016  . Chronic groin pain (Bilateral) (L>R) 05/23/2016  . Disturbance of skin sensation 05/23/2016  . Long term current use of opiate analgesic 05/23/2016  . Long term prescription opiate use 05/23/2016  . Opiate use 05/23/2016  . Encounter for pain management planning 05/23/2016   Resolved Ambulatory Problems    Diagnosis Date Noted  . No Resolved Ambulatory Problems   Past Medical History:  Diagnosis Date  . Macular degeneration   . RLS (restless legs syndrome)   . Thyroid disease    Constitutional Exam  General appearance: Well nourished, well developed, and well hydrated. In no apparent acute distress Vitals:   05/23/16 0758  BP: (!) 156/75  Pulse: 74  Temp: 98 F (36.7 C)  SpO2: 100%  Weight: 120 lb (54.4 kg)  Height: 5\' 3"  (1.6 m)  BMI Assessment: Estimated body mass index is 21.26 kg/m as calculated from the following:   Height as of this encounter: 5\' 3"  (1.6 m).   Weight as of this encounter: 120 lb (54.4 kg).   BMI interpretation:           BMI Readings from Last 4 Encounters:  05/23/16 21.26 kg/m  04/11/16 21.26 kg/m   Wt Readings from Last 4 Encounters:  05/23/16 120 lb (54.4 kg)  04/11/16 120 lb (54.4 kg)  Psych/Mental status: Alert, oriented x 3 (person, place, & time) Eyes: PERLA Respiratory: No evidence of acute respiratory distress  Cervical Spine Exam  Inspection: No masses, redness, or swelling Alignment: Symmetrical Functional ROM: Unrestricted ROM Stability: No instability detected Muscle strength & Tone: Functionally intact Sensory: Unimpaired Palpation: Non-contributory  Upper Extremity (UE) Exam    Side: Right upper extremity  Side: Left upper extremity  Inspection: No masses, redness, swelling, or asymmetry  Inspection: No masses, redness, swelling, or asymmetry  Functional ROM: Unrestricted ROM          Functional ROM: Unrestricted ROM          Muscle strength & Tone: Functionally intact  Muscle strength & Tone: Functionally intact  Sensory: Unimpaired  Sensory: Unimpaired  Palpation: Non-contributory  Palpation: Non-contributory   Thoracic Spine Exam  Inspection: No masses, redness, or swelling Alignment: Symmetrical Functional ROM: Unrestricted ROM Stability: No instability detected Sensory: Unimpaired Muscle strength & Tone: Functionally intact Palpation: Non-contributory  Lumbar Spine Exam  Inspection: No masses, redness, or swelling Alignment: Symmetrical Functional ROM: Unrestricted ROM Stability: No instability detected Muscle strength & Tone: Functionally intact Sensory: Unimpaired Palpation: Non-contributory Provocative Tests: Lumbar Hyperextension and rotation test: evaluation deferred today       Patrick's Maneuver: evaluation deferred today              Gait & Posture Assessment  Ambulation: Unassisted Gait: Relatively normal for age and body habitus Posture: WNL   Lower Extremity Exam    Side: Right lower extremity  Side: Left lower extremity  Inspection: No masses, redness, swelling, or asymmetry  Inspection: No masses, redness, swelling, or asymmetry  Functional ROM: Unrestricted ROM          Functional ROM: Unrestricted ROM          Muscle strength & Tone: Functionally intact  Muscle strength & Tone: Functionally intact  Sensory: Unimpaired  Sensory: Unimpaired  Palpation:  Non-contributory  Palpation: Non-contributory   Assessment  Primary Diagnosis & Pertinent Problem List: The primary encounter diagnosis was Chronic pain. Diagnoses of Lumbar radiculitis, Failed back surgical syndrome, Chronic low back pain (Location of Secondary source of pain) (Bilateral) (L>R), Neurogenic pain, RLS (restless legs syndrome), Chronic pain of both lower extremities, Chronic groin pain, unspecified laterality, Disturbance of skin sensation, Chronic diastolic CHF  (congestive heart failure) (HCC), Other osteoporosis without current pathological fracture, Long term current use of opiate analgesic, Long term prescription opiate use, Opiate use, and Encounter for pain management planning were also pertinent to this visit.  Visit Diagnosis: 1. Chronic pain   2. Lumbar radiculitis   3. Failed back surgical syndrome   4. Chronic low back pain (Location of Secondary source of pain) (Bilateral) (L>R)   5. Neurogenic pain   6. RLS (restless legs syndrome)   7. Chronic pain of both lower extremities   8. Chronic groin pain, unspecified laterality   9. Disturbance of skin sensation   10. Chronic diastolic CHF (congestive heart failure) (HCC)   11. Other osteoporosis without current pathological fracture   12. Long term current use of opiate analgesic   13. Long term prescription opiate use   14. Opiate use   15. Encounter for pain management planning    Plan of Care  Initial Treatment Plan:  Please be advised that as per protocol, today's visit has been an evaluation only. We have not taken over the patient's controlled substance management.  Problem-Specific Plan: No problem-specific Assessment & Plan notes found for this encounter.  Ordered Lab-work, Procedure(s), Referral(s), & Consult(s): Orders Placed This Encounter  Procedures  . DG Lumbar Spine Complete W/Bend  . DG Si Joints  . DG HIP UNILAT W OR W/O PELVIS 2-3 VIEWS LEFT  . DG HIP UNILAT W OR W/O PELVIS 2-3 VIEWS RIGHT  . DG Thoracic Spine 2 View  . Compliance Drug Analysis, Ur  . Comprehensive metabolic panel  . Magnesium  . Sedimentation rate  . 25-Hydroxyvitamin D Lcms D2+D3  . Vitamin B12  . Brain natriuretic peptide  . C-reactive protein   Pharmacotherapy: Medications ordered:  No orders of the defined types were placed in this encounter.  Medications administered during this visit: Ms. Zingaro had no medications administered during this visit.   Pharmacotherapy under  consideration:  Opioid Analgesics: The patient was informed that there is no guarantee that she would be a candidate for opioid analgesics. The decision will be made following CDC guidelines. This decision will be based on the results of diagnostic studies, as well as Ms. Linford's risk profile.    Interventional therapies under consideration: The patient was informed that there is no guarantee that she would be a candidate for interventional therapies. The decision will be based on the results of diagnostic studies, as well as Ms. Sherrod's risk profile. Diagnostic caudal epidural steroid injection under fluoroscopic guidance + diagnostic epidurogram. Depending on the results, the patient may be a candidate for a Racz procedure.    Requested PM Follow-up: Return in about 2 weeks (around 06/06/2016) for 2nd Visit Eval.  No future appointments.  Primary Care Physician: Lauro Regulus., MD Location: [facility] Out[facility] Pain Management Facility Note by: Sydnee Levans. Laban Emperor, M.D, DABA, DABAPM, DABPM, DABIPP, FIPP  Pain Score Disclaimer: We use the NRS-11 scale. This is a self-reported, subjective measurement of pain severity with only modest accuracy. It is used primarily to identify changes within a particular patient. It must be understood that outpatient pain  scales are significantly less accurate that those used for research, where they can be applied under ideal controlled circumstances with minimal exposure to variables. In reality, the score is likely to be a combination of pain intensity and pain affect, where pain affect describes the degree of emotional arousal or changes in action readiness caused by the sensory experience of pain. Factors such as social and work situation, setting, emotional state, anxiety levels, expectation, and prior pain experience may influence pain perception and show large inter-individual differences that may also be affected by time variables.  Patient  instructions provided during this appointment: Patient Instructions  Instructed to [facility] mall for blood work and x rays.

## 2016-05-23 NOTE — Patient Instructions (Signed)
Instructed to [facility] for blood work and x rays.

## 2016-05-26 LAB — 25-HYDROXYVITAMIN D LCMS D2+D3: 25-HYDROXY, VITAMIN D: 35 ng/mL

## 2016-05-26 LAB — 25-HYDROXY VITAMIN D LCMS D2+D3
25-Hydroxy, Vitamin D-2: 1.1 ng/mL
25-Hydroxy, Vitamin D-3: 34 ng/mL

## 2016-05-28 ENCOUNTER — Other Ambulatory Visit: Payer: Self-pay

## 2016-06-07 NOTE — Progress Notes (Signed)
-   Normal chloride levels are between 95 and 107 mEq/L. Low levels may be due to: Addison disease; Bartter syndrome; burns; congestive heart failure; dehydration; excessive sweating; hyperaldosteronism; metabolic alkalosis; respiratory acidosis (compensated); Syndrome of inappropriate diuretic hormone secretion (SIADH); or vomiting. - Normal fasting (NPO x 8 hours) glucose levels are between 65-99 mg/dl, with 2 hour fasting, levels are usually less than 140 mg/dl. Any random blood glucose level greater than 200 mg/dl is considered to be Diabetes.

## 2016-07-10 ENCOUNTER — Ambulatory Visit: Payer: Medicare Other | Attending: Pain Medicine | Admitting: Pain Medicine

## 2016-07-10 ENCOUNTER — Encounter: Payer: Self-pay | Admitting: Pain Medicine

## 2016-07-10 VITALS — BP 154/72 | HR 81 | Temp 97.6°F | Resp 16 | Ht 63.0 in | Wt 120.0 lb

## 2016-07-10 DIAGNOSIS — M545 Low back pain, unspecified: Secondary | ICD-10-CM

## 2016-07-10 DIAGNOSIS — Z87891 Personal history of nicotine dependence: Secondary | ICD-10-CM | POA: Diagnosis not present

## 2016-07-10 DIAGNOSIS — Z79891 Long term (current) use of opiate analgesic: Secondary | ICD-10-CM | POA: Diagnosis not present

## 2016-07-10 DIAGNOSIS — G8929 Other chronic pain: Secondary | ICD-10-CM | POA: Insufficient documentation

## 2016-07-10 DIAGNOSIS — M79605 Pain in left leg: Secondary | ICD-10-CM | POA: Insufficient documentation

## 2016-07-10 DIAGNOSIS — Z880 Allergy status to penicillin: Secondary | ICD-10-CM | POA: Diagnosis not present

## 2016-07-10 DIAGNOSIS — M79604 Pain in right leg: Secondary | ICD-10-CM | POA: Insufficient documentation

## 2016-07-10 DIAGNOSIS — G4701 Insomnia due to medical condition: Secondary | ICD-10-CM | POA: Diagnosis not present

## 2016-07-10 DIAGNOSIS — Z79899 Other long term (current) drug therapy: Secondary | ICD-10-CM | POA: Insufficient documentation

## 2016-07-10 DIAGNOSIS — H3530 Unspecified macular degeneration: Secondary | ICD-10-CM | POA: Insufficient documentation

## 2016-07-10 DIAGNOSIS — G2581 Restless legs syndrome: Secondary | ICD-10-CM | POA: Insufficient documentation

## 2016-07-10 DIAGNOSIS — Z5181 Encounter for therapeutic drug level monitoring: Secondary | ICD-10-CM | POA: Insufficient documentation

## 2016-07-10 DIAGNOSIS — M25512 Pain in left shoulder: Secondary | ICD-10-CM | POA: Insufficient documentation

## 2016-07-10 MED ORDER — MAGNESIUM OXIDE -MG SUPPLEMENT 500 MG PO CAPS: 1.0000 | ORAL_CAPSULE | Freq: Two times a day (BID) | ORAL | 1 refills | Status: AC

## 2016-07-10 MED ORDER — HYDROCODONE-ACETAMINOPHEN 5-325 MG PO TABS: 1.0000 | ORAL_TABLET | Freq: Three times a day (TID) | ORAL | 0 refills | Status: AC | PRN

## 2016-07-10 MED ORDER — ROPINIROLE HCL 0.25 MG PO TABS: 0.2500 mg | ORAL_TABLET | Freq: Every day | ORAL | 1 refills | Status: AC

## 2016-07-10 MED ORDER — MELATONIN 10 MG PO CAPS: 20.0000 mg | ORAL_CAPSULE | Freq: Every evening | ORAL | 2 refills | Status: AC | PRN

## 2016-07-10 NOTE — Progress Notes (Signed)
Patient's Name: Julie Rangel  MRN: [id number]  Referring Provider: Kirk Ruths, MD  DOB: June 02, 1922  PCP: Kirk Ruths, MD  DOS: 07/10/2016  Note by: Kathlen Brunswick. Dossie Arbour, MD  Service setting: Ambulatory outpatient  Specialty: Interventional Pain Management  Location: [facility] (AMB) Pain Management Facility    Patient type: Established   Primary Reason(s) for Visit: Encounter for evaluation before starting new chronic pain management plan of care (Level of risk: moderate) CC: Leg Pain; Back Pain (low); and Shoulder Pain (left)  HPI  Julie Rangel is a 82 y.o. year old, female patient, who comes today for a follow-up evaluation to review the test results and decide on a treatment plan. She has Hardening of the aorta (main artery of the heart) ([place]); Chronic diastolic CHF (congestive heart failure) ([place]); Degenerative disc disease, lumbar; Generalized OA; History of depression; Hypothyroid; Insomnia secondary to chronic pain; Lumbar radiculitis; Lumbar spondylosis; Osteoporosis; Pure hypercholesterolemia; RLS (restless legs syndrome); Chronic pain; Failed back surgical syndrome; Chronic low back pain (Location of Secondary source of pain) (Bilateral) (L>R); Neurogenic pain; Chronic lower extremity pain (Location of Primary Source of Pain) (Bilateral) (L>R); Chronic groin pain (Bilateral) (L>R); Disturbance of skin sensation; Long term current use of opiate analgesic; Long term prescription opiate use; Opiate use; and Encounter for pain management planning on her problem list. Her primarily concern today is the Leg Pain; Back Pain (low); and Shoulder Pain (left)  Pain Assessment: Self-Reported Pain Score: 7 /10 Clinically the patient looks like a 2/10 Reported level is inconsistent with clinical observations. Information on the proper use of the pain score provided to the patient today. Pain Type: Chronic pain Pain Location: Leg (low back) Pain Orientation: Right, Left Pain Descriptors /  Indicators: Burning Pain Frequency: Constant  Julie Rangel comes in today for a follow-up visit after her initial evaluation on 05/23/2016. Today we went over the results of her tests. These were explained in "L[place]man's terms". During today's appointment we went over my diagnostic impression, as well as the proposed treatment plan.  In considering the treatment plan options, Julie Rangel was reminded that I no longer take patients for medication management only. I asked her to let me know if she had no intention of taking advantage of the interventional therapies, so that we could make arrangements to provide this space to someone interested. I also made it clear that undergoing interventional therapies for the purpose of getting pain medications is very inappropriate on the part of a patient, and it will not be tolerated in this practice. This type of behavior would suggest true addiction and therefore it requires referral to an addiction specialist.   Further details on both, my assessment(s), as well as the proposed treatment plan, please see below. Controlled Substance Pharmacotherapy Assessment REMS (Risk Evaluation and Mitigation Strategy)  Analgesic: Hydrocodone/APAP 5/325 one every 6 hours (20 mg/day of hydrocodone) MME/day: 20 mg/day Pill Count: None expected due to no prior prescriptions written by our practice. Pharmacokinetics: Liberation and absorption (onset of action): WNL Distribution (time to peak effect): WNL Metabolism and excretion (duration of action): WNL         Pharmacodynamics: Desired effects: Analgesia: Julie Rangel reports >50% benefit. Functional ability: Patient reports that medication allows her to accomplish basic ADLs Clinically meaningful improvement in function (CMIF): Sustained CMIF goals met Perceived effectiveness: Described as relatively effective, allowing for increase in activities of daily living (ADL) Undesirable effects: Side-effects or Adverse  reactions: None reported Monitoring: [place] PMP: Online review of  the past 38-monthperiod previously conducted. Not applicable at this point since we have not taken over the patient's medication management yet. List of all UDS test(s) done:  No results found for: TOXASSSELUR, SUMMARY Last UDS on record: No results found for: TOXASSSELUR, SUMMARY UDS interpretation: No unexpected findings.          Medication Assessment Form: Patient introduced to form today Treatment compliance: Treatment may start today if patient agrees with proposed plan. Evaluation of compliance is not applicable at this point Risk Assessment Profile: Aberrant behavior: See initial evaluations. None observed or detected today Comorbid factors increasing risk of overdose: See initial evaluation. No additional risks detected today Risk Mitigation Strategies:  Patient opioid safety counseling: Completed today. Counseling provided to patient as per "Patient Counseling Document". Document signed by patient, attesting to counseling and understanding Patient-Prescriber Agreement (PPA): Obtained today  Controlled substance notification to other providers: Written and sent today  Pharmacologic Plan: Today we may be taking over the patient's pharmacological regimen. See below  Laboratory Chemistry  Inflammation Markers Lab Results  Component Value Date   ESRSEDRATE 6 05/23/2016   Renal Function Lab Results  Component Value Date   BUN 16 05/23/2016   CREATININE 0.79 05/23/2016   GFRAA >60 05/23/2016   GFRNONAA >60 05/23/2016   Hepatic Function Lab Results  Component Value Date   AST 27 05/23/2016   ALT 15 05/23/2016   ALBUMIN 3.9 05/23/2016   Electrolytes Lab Results  Component Value Date   NA 137 05/23/2016   K 3.6 05/23/2016   CL 99 (L) 05/23/2016   CALCIUM 9.5 05/23/2016   MG 1.9 05/23/2016   Pain Modulating Vitamins Lab Results  Component Value Date   25OHVITD1 35 05/23/2016   25OHVITD2 1.1 05/23/2016    25OHVITD3 34 05/23/2016   VITAMINB12 355 05/23/2016   Coagulation Parameters Lab Results  Component Value Date   PLT 290 04/11/2016   Cardiovascular Lab Results  Component Value Date   BNP 50.0 05/23/2016   HGB 12.2 04/11/2016   HCT 37.0 04/11/2016   Note: Lab results reviewed.  Recent Diagnostic Imaging Review  Dg Thoracic Spine 2 View  Result Date: 05/23/2016 CLINICAL DATA:  Chronic back pain EXAM: THORACIC SPINE 2 VIEWS COMPARISON:  04/11/2016 FINDINGS: No compression deformities are noted. The pedicles are within normal limits. Mild osteophytic changes are seen. The visualized ribcage is within normal limits. IMPRESSION: Mild degenerative change without acute abnormality. Electronically Signed   By: MInez CatalinaM.D.   On: 05/23/2016 12:50   Dg Lumbar Spine Complete W/bend  Result Date: 05/23/2016 CLINICAL DATA:  Chronic back pain.  Lumbar radiculitis. EXAM: LUMBAR SPINE - COMPLETE WITH BENDING VIEWS COMPARISON:  08/01/2008 FINDINGS: Alignment of lumbar spine is within normal limits on neutral image. There is severe disc space loss at L1-L2 and L2-L3. The vertebral body heights are maintained. Degenerative endplate changes at L1 through L3. Normal alignment at the thoracolumbar junction. Bilateral facet arthropathy. No evidence for pars defect. Minimal anterolisthesis at L4-L5 with flexion. IMPRESSION: Multilevel degenerative disease. No evidence for a compression fracture. Minimal spondylolisthesis at L4-L5 with flexion. Electronically Signed   By: AMarkus DaftM.D.   On: 05/23/2016 12:38   Dg Si Joints  Result Date: 05/23/2016 CLINICAL DATA:  Chronic bilateral low back pain without sciatica. Chronic groin pain, unspecified laterality. EXAM: BILATERAL SACROILIAC JOINTS - 3+ VIEW COMPARISON:  Pelvic CT 07/21/2008 FINDINGS: Sacroiliac joint spaces are maintained with mild degenerative changes. No evidence for ankylosis. Sacral arcuate  lines are intact. Visualized pelvic bony  structures are intact. Degenerative changes in the lower lumbar spine. IMPRESSION: No acute abnormality. Electronically Signed   By: Richarda Overlie M.D.   On: 05/23/2016 12:46   Dg Hip Unilat W Or W/o Pelvis 2-3 Views Left  Result Date: 05/23/2016 CLINICAL DATA:  Chronic low back pain and hip pain, initial encounter EXAM: DG HIP (WITH OR WITHOUT PELVIS) 2-3V LEFT COMPARISON:  None. FINDINGS: The pelvic ring is intact. No acute fracture or dislocation is seen. Degenerative change of the lumbar spine is noted. No acute fracture or dislocation is seen. IMPRESSION: Degenerative change without acute abnormality. Electronically Signed   By: Alcide Clever M.D.   On: 05/23/2016 12:48   Dg Hip Unilat W Or W/o Pelvis 2-3 Views Right  Result Date: 05/23/2016 CLINICAL DATA:  Right hip pain EXAM: DG HIP (WITH OR WITHOUT PELVIS) 2-3V RIGHT COMPARISON:  None. FINDINGS: Pelvic ring is intact. Mild degenerative change of the hip joint is noted. No acute fracture or dislocation is seen. No soft tissue abnormality is noted. IMPRESSION: No acute abnormality noted. Electronically Signed   By: Alcide Clever M.D.   On: 05/23/2016 12:48   Cervical Imaging: Cervical MR wo contrast: No results found for this or any previous visit. Cervical MR wo contrast: No results found for this or any previous visit. Cervical MR w/wo contrast: No results found for this or any previous visit. Cervical MR w contrast: No results found for this or any previous visit. Cervical CT wo contrast: No results found for this or any previous visit. Cervical CT w/wo contrast: No results found for this or any previous visit. Cervical CT w/wo contrast: No results found for this or any previous visit. Cervical CT w contrast: No results found for this or any previous visit. Cervical CT outside: No results found for this or any previous visit. Cervical DG 1 view: No results found for this or any previous visit. Cervical DG 2-3 views: No results found for  this or any previous visit. Cervical DG F/E views: No results found for this or any previous visit. Cervical DG 2-3 clearing views: No results found for this or any previous visit. Cervical DG Bending/F/E views: No results found for this or any previous visit. Cervical DG complete: No results found for this or any previous visit. Cervical DG Myelogram views: No results found for this or any previous visit. Cervical DG Myelogram views: No results found for this or any previous visit. Cervical Discogram views: No results found for this or any previous visit.  Shoulder Imaging: Shoulder-R MR w contrast: No results found for this or any previous visit. Shoulder-L MR w contrast: No results found for this or any previous visit. Shoulder-R MR w/wo contrast: No results found for this or any previous visit. Shoulder-L MR w/wo contrast: No results found for this or any previous visit. Shoulder-R MR wo contrast: No results found for this or any previous visit. Shoulder-L MR wo contrast: No results found for this or any previous visit. Shoulder-R CT w contrast: No results found for this or any previous visit. Shoulder-L CT w contrast: No results found for this or any previous visit. Shoulder-R CT w/wo contrast: No results found for this or any previous visit. Shoulder-L CT w/wo contrast: No results found for this or any previous visit. Shoulder-R CT wo contrast: No results found for this or any previous visit. Shoulder-L CT wo contrast: No results found for this or any previous visit. Shoulder-R  DG Arthrogram: No results found for this or any previous visit. Shoulder-L DG Arthrogram: No results found for this or any previous visit. Shoulder-R DG 1 view: No results found for this or any previous visit. Shoulder-L DG 1 view: No results found for this or any previous visit. Shoulder-R DG: No results found for this or any previous visit. Shoulder-L DG: No results found for this or any previous  visit.  Thoracic Imaging: Thoracic MR wo contrast: No results found for this or any previous visit. Thoracic MR wo contrast: No results found for this or any previous visit. Thoracic MR w/wo contrast: No results found for this or any previous visit. Thoracic MR w contrast: No results found for this or any previous visit. Thoracic CT wo contrast: No results found for this or any previous visit. Thoracic CT w/wo contrast: No results found for this or any previous visit. Thoracic CT w/wo contrast: No results found for this or any previous visit. Thoracic CT w contrast: No results found for this or any previous visit. Thoracic DG 2-3 views:  Results for orders placed during the hospital encounter of 05/23/16  DG Thoracic Spine 2 View   Narrative CLINICAL DATA:  Chronic back pain  EXAM: THORACIC SPINE 2 VIEWS  COMPARISON:  04/11/2016  FINDINGS: No compression deformities are noted. The pedicles are within normal limits. Mild osteophytic changes are seen. The visualized ribcage is within normal limits.  IMPRESSION: Mild degenerative change without acute abnormality.   Electronically Signed   By: Alcide Clever M.D.   On: 05/23/2016 12:50    Thoracic DG 4 views: No results found for this or any previous visit. Thoracic DG: No results found for this or any previous visit. Thoracic DG w/swimmers view: No results found for this or any previous visit. Thoracic DG Myelogram views: No results found for this or any previous visit. Thoracic DG Myelogram views: No results found for this or any previous visit.  Lumbosacral Imaging: Lumbar MR wo contrast: No results found for this or any previous visit. Lumbar MR wo contrast: No results found for this or any previous visit. Lumbar MR w/wo contrast: No results found for this or any previous visit. Lumbar MR w contrast: No results found for this or any previous visit. Lumbar CT wo contrast: No results found for this or any previous  visit. Lumbar CT w/wo contrast: No results found for this or any previous visit. Lumbar CT w/wo contrast: No results found for this or any previous visit. Lumbar CT w contrast: No results found for this or any previous visit. Lumbar DG 1V: No results found for this or any previous visit. Lumbar DG 1V (Clearing): No results found for this or any previous visit. Lumbar DG 2-3V (Clearing): No results found for this or any previous visit. Lumbar DG 2-3 views: No results found for this or any previous visit. Lumbar DG (Complete) 4+V: No results found for this or any previous visit. Lumbar DG F/E views: No results found for this or any previous visit. Lumbar DG Bending views:  Results for orders placed during the hospital encounter of 05/23/16  DG Lumbar Spine Complete W/Bend   Narrative CLINICAL DATA:  Chronic back pain.  Lumbar radiculitis.  EXAM: LUMBAR SPINE - COMPLETE WITH BENDING VIEWS  COMPARISON:  08/01/2008  FINDINGS: Alignment of lumbar spine is within normal limits on neutral image. There is severe disc space loss at L1-L2 and L2-L3. The vertebral body heights are maintained. Degenerative endplate changes at L1 through  L3. Normal alignment at the thoracolumbar junction. Bilateral facet arthropathy. No evidence for pars defect. Minimal anterolisthesis at L4-L5 with flexion.  IMPRESSION: Multilevel degenerative disease. No evidence for a compression fracture.  Minimal spondylolisthesis at L4-L5 with flexion.   Electronically Signed   By: Markus Daft M.D.   On: 05/23/2016 12:38    Lumbar DG Myelogram views: No results found for this or any previous visit. Lumbar DG Myelogram: No results found for this or any previous visit. Lumbar DG Myelogram: No results found for this or any previous visit. Lumbar DG Myelogram: No results found for this or any previous visit. Lumbar DG Myelogram Lumbosacral: No results found for this or any previous visit. Lumbar DG Diskogram views: No  results found for this or any previous visit. Lumbar DG Diskogram views: No results found for this or any previous visit. Lumbar DG Epidurogram OP: No results found for this or any previous visit. Lumbar DG Epidurogram IP: No results found for this or any previous visit.  Sacroiliac Joint Imaging: Sacroiliac Joint DG:  Results for orders placed during the hospital encounter of 05/23/16  DG Si Joints   Narrative CLINICAL DATA:  Chronic bilateral low back pain without sciatica. Chronic groin pain, unspecified laterality.  EXAM: BILATERAL SACROILIAC JOINTS - 3+ VIEW  COMPARISON:  Pelvic CT 07/21/2008  FINDINGS: Sacroiliac joint spaces are maintained with mild degenerative changes. No evidence for ankylosis. Sacral arcuate lines are intact. Visualized pelvic bony structures are intact. Degenerative changes in the lower lumbar spine.  IMPRESSION: No acute abnormality.   Electronically Signed   By: Markus Daft M.D.   On: 05/23/2016 12:46    Sacroiliac Joint MR w/wo contrast: No results found for this or any previous visit. Sacroiliac Joint MR wo contrast: No results found for this or any previous visit.  Spine Imaging: Whole Spine DG Myelogram views: No results found for this or any previous visit. Whole Spine MR Mets screen: No results found for this or any previous visit. Whole Spine MR Mets screen: No results found for this or any previous visit. Whole Spine MR w/wo: No results found for this or any previous visit. MRA Spinal Canal w/ cm: No results found for this or any previous visit. MRA Spinal Canal wo/ cm: No results found for this or any previous visit. MRA Spinal Canal w/wo cm: No results found for this or any previous visit. Spine Outside MR Films: No results found for this or any previous visit. Spine Outside CT Films: No results found for this or any previous visit. CT-Guided Biopsy: No results found for this or any previous visit. CT-Guided Needle Placement: No  results found for this or any previous visit. DG Spine outside: No results found for this or any previous visit. IR Spine outside: No results found for this or any previous visit. NM Spine outside: No results found for this or any previous visit. Epidurography 1: No results found for this or any previous visit. Epidurography 2: No results found for this or any previous visit.  Hip Imaging: Hip-R MR w contrast: No results found for this or any previous visit. Hip-L MR w contrast: No results found for this or any previous visit. Hip-R MR w/wo contrast: No results found for this or any previous visit. Hip-L MR w/wo contrast: No results found for this or any previous visit. Hip-R MR wo contrast: No results found for this or any previous visit. Hip-L MR wo contrast: No results found for this or any previous  visit. Hip-R CT w contrast: No results found for this or any previous visit. Hip-L CT w contrast: No results found for this or any previous visit. Hip-R CT w/wo contrast: No results found for this or any previous visit. Hip-L CT w/wo contrast: No results found for this or any previous visit. Hip-R CT wo contrast: No results found for this or any previous visit. Hip-L CT wo contrast: No results found for this or any previous visit. Hip-R DG 2-3 views:  Results for orders placed during the hospital encounter of 05/23/16  DG HIP UNILAT W OR W/O PELVIS 2-3 VIEWS RIGHT   Narrative CLINICAL DATA:  Right hip pain  EXAM: DG HIP (WITH OR WITHOUT PELVIS) 2-3V RIGHT  COMPARISON:  None.  FINDINGS: Pelvic ring is intact. Mild degenerative change of the hip joint is noted. No acute fracture or dislocation is seen. No soft tissue abnormality is noted.  IMPRESSION: No acute abnormality noted.   Electronically Signed   By: Inez Catalina M.D.   On: 05/23/2016 12:48    Hip-L DG 2-3 views:  Results for orders placed during the hospital encounter of 05/23/16  DG HIP UNILAT W OR W/O PELVIS 2-3  VIEWS LEFT   Narrative CLINICAL DATA:  Chronic low back pain and hip pain, initial encounter  EXAM: DG HIP (WITH OR WITHOUT PELVIS) 2-3V LEFT  COMPARISON:  None.  FINDINGS: The pelvic ring is intact. No acute fracture or dislocation is seen. Degenerative change of the lumbar spine is noted. No acute fracture or dislocation is seen.  IMPRESSION: Degenerative change without acute abnormality.   Electronically Signed   By: Inez Catalina M.D.   On: 05/23/2016 12:48    Hip-R DG Arthrogram: No results found for this or any previous visit. Hip-L DG Arthrogram: No results found for this or any previous visit. Hip-B DG Bilateral: No results found for this or any previous visit.  Knee Imaging: Knee-R MR w contrast: No results found for this or any previous visit. Knee-L MR w contrast: No results found for this or any previous visit. Knee-R MR w/wo contrast: No results found for this or any previous visit. Knee-L MR w/wo contrast: No results found for this or any previous visit. Knee-R MR wo contrast: No results found for this or any previous visit. Knee-L MR wo contrast: No results found for this or any previous visit. Knee-R CT w contrast: No results found for this or any previous visit. Knee-L CT w contrast: No results found for this or any previous visit. Knee-R CT w/wo contrast: No results found for this or any previous visit. Knee-L CT w/wo contrast: No results found for this or any previous visit. Knee-R CT wo contrast: No results found for this or any previous visit. Knee-L CT wo contrast: No results found for this or any previous visit. Knee-R DG 1-2 views: No results found for this or any previous visit. Knee-L DG 1-2 views: No results found for this or any previous visit. Knee-R DG 3 views: No results found for this or any previous visit. Knee-L DG 3 views: No results found for this or any previous visit. Knee-R DG 4 views: No results found for this or any previous  visit. Knee-L DG 4 views: No results found for this or any previous visit. Knee-R DG Arthrogram: No results found for this or any previous visit. Knee-L DG Arthrogram: No results found for this or any previous visit.  Note: Imaging results reviewed.  Meds  The patient  has a current medication list which includes the following prescription(s): estrogens (conjugated), furosemide, gabapentin, levothyroxine, multiple vitamins-minerals, potassium chloride, premarin, senna, torsemide, hydrocodone-acetaminophen, magnesium oxide, melatonin, and ropinirole.  Current Outpatient Prescriptions on File Prior to Visit  Medication Sig  . furosemide (LASIX) 40 MG tablet   . gabapentin (NEURONTIN) 100 MG capsule Take 100 mg by mouth 2 (two) times daily.   Marland Kitchen levothyroxine (SYNTHROID, LEVOTHROID) 88 MCG tablet TAKE 1 TABLET DAILY ON AN EMPTY STOMACH WITH A GLASS OF WATER AT LEAST 30 TO 60 MINUTES BEFORE BREAKFAST  . Multiple Vitamins-Minerals (PRESERVISION AREDS 2+MULTI VIT PO) Take 1 tablet by mouth 2 (two) times daily.  . potassium chloride (K-DUR) 10 MEQ tablet Take 10 mEq by mouth daily.   Marland Kitchen PREMARIN 0.3 MG tablet Take 0.3 mg by mouth daily.   . Sennosides (SENNA) 8.6 MG CAPS Take 1 tablet by mouth.  . torsemide (DEMADEX) 20 MG tablet Take 20 mg by mouth daily.    No current facility-administered medications on file prior to visit.    ROS  Constitutional: Denies any fever or chills Gastrointestinal: No reported hemesis, hematochezia, vomiting, or acute GI distress Musculoskeletal: Denies any acute onset joint swelling, redness, loss of ROM, or weakness Neurological: No reported episodes of acute onset apraxia, aphasia, dysarthria, agnosia, amnesia, paralysis, loss of coordination, or loss of consciousness  Allergies  Ms. Fooks is allergic to lorazepam; penicillamine; penicillin g; penicillins; risedronate; and ropinirole.  [place]  Drug: Ms. Manzer  reports that she does not use drugs. Alcohol:   reports that she does not drink alcohol. Tobacco:  reports that she has quit smoking. She has never used smokeless tobacco. Medical:  has a past medical history of Macular degeneration; RLS (restless legs syndrome); and Thyroid disease. Family: family history is not on file.  Past Surgical History:  Procedure Laterality Date  . ABDOMINAL HYSTERECTOMY    . APPENDECTOMY    . CESAREAN SECTION    . THYROIDECTOMY    . TONSILLECTOMY     Constitutional Exam  General appearance: Well nourished, well developed, and well hydrated. In no apparent acute distress Vitals:   07/10/16 1359  BP: (!) 154/72  Pulse: 81  Resp: 16  Temp: 97.6 F (36.4 C)  SpO2: 96%  Weight: 120 lb (54.4 kg)  Height: '5\' 3"'$  (1.6 m)   BMI Assessment: Estimated body mass index is 21.26 kg/m as calculated from the following:   Height as of this encounter: '5\' 3"'$  (1.6 m).   Weight as of this encounter: 120 lb (54.4 kg).  BMI interpretation table: BMI level Category Range association with higher incidence of chronic pain  <18 kg/m2 Underweight   18.5-24.9 kg/m2 Ideal body weight   25-29.9 kg/m2 Overweight Increased incidence by 20%  30-34.9 kg/m2 Obese (Class I) Increased incidence by 68%  35-39.9 kg/m2 Severe obesity (Class II) Increased incidence by 136%  >40 kg/m2 Extreme obesity (Class III) Increased incidence by 254%   BMI Readings from Last 4 Encounters:  07/10/16 21.26 kg/m  05/23/16 21.26 kg/m  04/11/16 21.26 kg/m   Wt Readings from Last 4 Encounters:  07/10/16 120 lb (54.4 kg)  05/23/16 120 lb (54.4 kg)  04/11/16 120 lb (54.4 kg)  Psych/Mental status: Alert, oriented x 3 (person, place, & time) Eyes: PERLA Respiratory: No evidence of acute respiratory distress  Cervical Spine Exam  Inspection: No masses, redness, or swelling Alignment: Symmetrical Functional ROM: Unrestricted ROM Stability: No instability detected Muscle strength & Tone: Functionally intact Sensory: Unimpaired Palpation:  Non-contributory  Upper Extremity (UE) Exam    Side: Right upper extremity  Side: Left upper extremity  Inspection: No masses, redness, swelling, or asymmetry  Inspection: No masses, redness, swelling, or asymmetry  Functional ROM: Unrestricted ROM          Functional ROM: Unrestricted ROM          Muscle strength & Tone: Functionally intact  Muscle strength & Tone: Functionally intact  Sensory: Unimpaired  Sensory: Unimpaired  Palpation: Non-contributory  Palpation: Non-contributory   Thoracic Spine Exam  Inspection: No masses, redness, or swelling Alignment: Symmetrical Functional ROM: Unrestricted ROM Stability: No instability detected Sensory: Unimpaired Muscle strength & Tone: Functionally intact Palpation: Non-contributory  Lumbar Spine Exam  Inspection: No masses, redness, or swelling Alignment: Symmetrical Functional ROM: Unrestricted ROM Stability: No instability detected Muscle strength & Tone: Functionally intact Sensory: Unimpaired Palpation: Non-contributory Provocative Tests: Lumbar Hyperextension and rotation test: evaluation deferred today       Patrick's Maneuver: evaluation deferred today              Gait & Posture Assessment  Ambulation: Unassisted Gait: Relatively normal for age and body habitus Posture: WNL   Lower Extremity Exam    Side: Right lower extremity  Side: Left lower extremity  Inspection: No masses, redness, swelling, or asymmetry  Inspection: No masses, redness, swelling, or asymmetry  Functional ROM: Unrestricted ROM          Functional ROM: Unrestricted ROM          Muscle strength & Tone: Functionally intact  Muscle strength & Tone: Functionally intact  Sensory: Unimpaired  Sensory: Unimpaired  Palpation: Non-contributory  Palpation: Non-contributory   Assessment & Plan  Primary Diagnosis & Pertinent Problem List: The primary encounter diagnosis was RLS (restless legs syndrome). Diagnoses of Insomnia secondary to chronic pain and  Chronic low back pain (Location of Secondary source of pain) (Bilateral) (L>R) were also pertinent to this visit.  Visit Diagnosis: 1. RLS (restless legs syndrome)   2. Insomnia secondary to chronic pain   3. Chronic low back pain (Location of Secondary source of pain) (Bilateral) (L>R)    Problems updated and reviewed during this visit: No problems updated. Problem-specific Plan(s): No problem-specific Assessment & Plan notes found for this encounter.  No new Assessment & Plan notes have been filed under this hospital service since the last note was generated. Service: Pain Management  Plan of Care  Pharmacotherapy (Medications Ordered): Meds ordered this encounter  Medications  . rOPINIRole (REQUIP) 0.25 MG tablet    Sig: Take 1 tablet (0.25 mg total) by mouth at bedtime.    Dispense:  30 tablet    Refill:  1    Do not add this medication to the electronic "Automatic Refill" notification system. Patient may have prescription filled one day early if pharmacy is closed on scheduled refill date.  . Magnesium Oxide 500 MG CAPS    Sig: Take 1 capsule (500 mg total) by mouth 2 (two) times daily at 8 am and 10 pm.    Dispense:  60 capsule    Refill:  1    Do not place this medication, or any other prescription from our practice, on "Automatic Refill". Patient may have prescription filled one day early if pharmacy is closed on scheduled refill date.  . Melatonin 10 MG CAPS    Sig: Take 20 mg by mouth at bedtime as needed.    Dispense:  60 capsule    Refill:  2  Do not place this medication, or any other prescription from our practice, on "Automatic Refill". Patient may have prescription filled one day early if pharmacy is closed on scheduled refill date.  Marland Kitchen HYDROcodone-acetaminophen (NORCO/VICODIN) 5-325 MG tablet    Sig: Take 1 tablet by mouth every 8 (eight) hours as needed for moderate pain.    Dispense:  90 tablet    Refill:  0    Do not place this medication, or any other  prescription from our practice, on "Automatic Refill". Patient may have prescription filled one day early if pharmacy is closed on scheduled refill date. Do not fill until: 07/10/16 To last until: 08/09/16   Lab-work, procedure(s), and/or referral(s): No orders of the defined types were placed in this encounter.   Pharmacotherapy: Opioid Analgesics: We'll take over management today. See above orders Membrane stabilizer: We have discussed the possibility of optimizing this mode of therapy, if tolerated Muscle relaxant: We have discussed the possibility of a trial NSAID: We have discussed the possibility of a trial Other analgesic(s): To be determined at a later time   Interventional therapies: Planned, scheduled, and/or pending:    None at this time.    Considering:   Diagnostic bilateral lumbar facet block under fluoroscopic guidance and IV sedation. Possible bilateral lumbar facet radiofrequency ablation Diagnostic caudal epidural steroid injection under fluoroscopic guidance + diagnostic epidurogram.  Depending on the results, the patient may be a candidate for a Racz procedure.    PRN Procedures:   To be determined at a later time   Provider-requested follow-up: Return in about 2 months (around 09/09/2016).  No future appointments.  Primary Care Physician: Kirk Ruths, MD Location: [facility] Note by: Kathlen Brunswick. Dossie Arbour, M.D, DABA, DABAPM, DABPM, DABIPP, FIPP  Pain Score Disclaimer: We use the NRS-11 scale. This is a self-reported, subjective measurement of pain severity with only modest accuracy. It is used primarily to identify changes within a particular patient. It must be understood that outpatient pain scales are significantly less accurate that those used for research, where they can be applied under ideal controlled circumstances with minimal exposure to variables. In reality, the score is likely to be a combination of pain  intensity and pain affect, where pain affect describes the degree of emotional arousal or changes in action readiness caused by the sensory experience of pain. Factors such as social and work situation, setting, emotional state, anxiety levels, expectation, and prior pain experience may influence pain perception and show large inter-individual differences that may also be affected by time variables.  Patient instructions provided during this appointment: Patient Instructions  Acetaminophen; Hydrocodone tablets or capsules What is this medicine? ACETAMINOPHEN; HYDROCODONE (a set a MEE noe fen; hye droe KOE done) is a pain reliever. It is used to treat moderate to severe pain. This medicine may be used for other purposes; ask your health care provider or pharmacist if you have questions. COMMON BRAND NAME(S): Anexsia, Bancap HC, Ceta-Plus, Co-Gesic, Comfortpak, Dolagesic, [employer], DuoCet, Hydrocet, Hydrogesic, [place], Lorcet HD, Lorcet Plus, Lortab, Margesic H, Maxidone, [place], Polygesic, [place], [place], [place], Vicodin, Vicodin ES, Vicodin HP, Charlane Ferretti What should I tell my health care provider before I take this medicine? They need to know if you have any of these conditions: -brain tumor -Crohn's disease, inflammatory bowel disease, or ulcerative colitis -drug abuse or addiction -head injury -heart or circulation problems -if you often drink alcohol -kidney disease or problems going to the bathroom -liver disease -lung disease,  asthma, or breathing problems -an unusual or allergic reaction to acetaminophen, hydrocodone, other opioid analgesics, other medicines, foods, dyes, or preservatives -pregnant or trying to get pregnant -breast-feeding How should I use this medicine? Take this medicine by mouth with a glass of water. Follow the directions on the prescription label. You can take it with or without food. If it upsets your stomach, take it with food. Do not take your  medicine more often than directed. A special MedGuide will be given to you by the pharmacist with each prescription and refill. Be sure to read this information carefully each time. Talk to your pediatrician regarding the use of this medicine in children. Special care may be needed. Overdosage: If you think you have taken too much of this medicine contact a poison control center or emergency room at once. NOTE: This medicine is only for you. Do not share this medicine with others. What if I miss a dose? If you miss a dose, take it as soon as you can. If it is almost time for your next dose, take only that dose. Do not take double or extra doses. What may interact with this medicine? This medicine may interact with the following medications: -alcohol -antiviral medicines for HIV or AIDS -atropine -antihistamines for allergy, cough and cold -certain antibiotics like erythromycin, clarithromycin -certain medicines for anxiety or sleep -certain medicines for bladder problems like oxybutynin, tolterodine -certain medicines for depression like amitriptyline, fluoxetine, sertraline -certain medicines for fungal infections like ketoconazole and itraconazole -certain medicines for Parkinson's disease like benztropine, trihexyphenidyl -certain medicines for seizures like carbamazepine, phenobarbital, phenytoin, primidone -certain medicines for stomach problems like dicyclomine, hyoscyamine -certain medicines for travel sickness like scopolamine -general anesthetics like halothane, isoflurane, methoxyflurane, propofol -ipratropium -local anesthetics like lidocaine, pramoxine, tetracaine -MAOIs like Carbex, Eldepryl, Marplan, Nardil, and Parnate -medicines that relax muscles for surgery -other medicines with acetaminophen -other narcotic medicines for pain or cough -phenothiazines like chlorpromazine, mesoridazine, prochlorperazine, thioridazine -rifampin This list may not describe all possible  interactions. Give your health care provider a list of all the medicines, herbs, non-prescription drugs, or dietary supplements you use. Also tell them if you smoke, drink alcohol, or use illegal drugs. Some items may interact with your medicine. What should I watch for while using this medicine? Tell your doctor or health care professional if your pain does not go away, if it gets worse, or if you have new or a different type of pain. You may develop tolerance to the medicine. Tolerance means that you will need a higher dose of the medicine for pain relief. Tolerance is normal and is expected if you take the medicine for a long time. Do not suddenly stop taking your medicine because you may develop a severe reaction. Your body becomes used to the medicine. This does NOT mean you are addicted. Addiction is a behavior related to getting and using a drug for a non-medical reason. If you have pain, you have a medical reason to take pain medicine. Your doctor will tell you how much medicine to take. If your doctor wants you to stop the medicine, the dose will be slowly lowered over time to avoid any side effects. There are different types of narcotic medicines (opiates). If you take more than one type at the same time or if you are taking another medicine that also causes drowsiness, you may have more side effects. Give your health care provider a list of all medicines you use. Your doctor will tell you how  much medicine to take. Do not take more medicine than directed. Call emergency for help if you have problems breathing or unusual sleepiness. Do not take other medicines that contain acetaminophen with this medicine. Always read labels carefully. If you have questions, ask your doctor or pharmacist. If you take too much acetaminophen get medical help right away. Too much acetaminophen can be very dangerous and cause liver damage. Even if you do not have symptoms, it is important to get help right away. You may  get drowsy or dizzy. Do not drive, use machinery, or do anything that needs mental alertness until you know how this medicine affects you. Do not stand or sit up quickly, especially if you are an older patient. This reduces the risk of dizzy or fainting spells. Alcohol may interfere with the effect of this medicine. Avoid alcoholic drinks. The medicine will cause constipation. Try to have a bowel movement at least every 2 to 3 days. If you do not have a bowel movement for 3 days, call your doctor or health care professional. Your mouth may get dry. Chewing sugarless gum or sucking hard candy, and drinking plenty of water may help. Contact your doctor if the problem does not go away or is severe. What side effects may I notice from receiving this medicine? Side effects that you should report to your doctor or health care professional as soon as possible: -allergic reactions like skin rash, itching or hives, swelling of the face, lips, or tongue -breathing problems -confusion -redness, blistering, peeling or loosening of the skin, including inside the mouth -signs and symptoms of low blood pressure like dizziness; feeling faint or lightheaded, falls; unusually weak or tired -trouble passing urine or change in the amount of urine -yellowing of the eyes or skin Side effects that usually do not require medical attention (report to your doctor or health care professional if they continue or are bothersome): -constipation -dry mouth -nausea, vomiting -tiredness This list may not describe all possible side effects. Call your doctor for medical advice about side effects. You may report side effects to FDA at 1-800-FDA-1088. Where should I keep my medicine? Keep out of the reach of children. This medicine can be abused. Keep your medicine in a safe place to protect it from theft. Do not share this medicine with anyone. Selling or giving away this medicine is dangerous and against the law. This medicine may  cause accidental overdose and death if it taken by other adults, children, or pets. Mix any unused medicine with a substance like cat litter or coffee grounds. Then throw the medicine away in a sealed container like a sealed bag or a coffee can with a lid. Do not use the medicine after the expiration date. Store at room temperature between 15 and 30 degrees C (59 and 86 degrees F). NOTE: This sheet is a summary. It may not cover all possible information. If you have questions about this medicine, talk to your doctor, pharmacist, or health care provider.  2017 [employer]vier/Gold Standard (2015-04-21 10:02:16)

## 2016-07-10 NOTE — Patient Instructions (Signed)
Acetaminophen; Hydrocodone tablets or capsules What is this medicine? ACETAMINOPHEN; HYDROCODONE (a set a MEE noe fen; hye droe KOE done) is a pain reliever. It is used to treat moderate to severe pain. This medicine may be used for other purposes; ask your health care provider or pharmacist if you have questions. COMMON BRAND NAME(S): Anexsia, Bancap HC, Ceta-Plus, Co-Gesic, Comfortpak, Dolagesic, [employer], DuoCet, Hydrocet, Hydrogesic, [place], Lorcet HD, Lorcet Plus, Lortab, Margesic H, Maxidone, [place], Polygesic, [place], [place], Cabin crew, Vicodin, Vicodin ES, Vicodin HP, Charlane Ferretti What should I tell my health care provider before I take this medicine? They need to know if you have any of these conditions: -brain tumor -Crohn's disease, inflammatory bowel disease, or ulcerative colitis -drug abuse or addiction -head injury -heart or circulation problems -if you often drink alcohol -kidney disease or problems going to the bathroom -liver disease -lung disease, asthma, or breathing problems -an unusual or allergic reaction to acetaminophen, hydrocodone, other opioid analgesics, other medicines, foods, dyes, or preservatives -pregnant or trying to get pregnant -breast-feeding How should I use this medicine? Take this medicine by mouth with a glass of water. Follow the directions on the prescription label. You can take it with or without food. If it upsets your stomach, take it with food. Do not take your medicine more often than directed. A special MedGuide will be given to you by the pharmacist with each prescription and refill. Be sure to read this information carefully each time. Talk to your pediatrician regarding the use of this medicine in children. Special care may be needed. Overdosage: If you think you have taken too much of this medicine contact a poison control center or emergency room at once. NOTE: This medicine is only for you. Do not share this medicine with  others. What if I miss a dose? If you miss a dose, take it as soon as you can. If it is almost time for your next dose, take only that dose. Do not take double or extra doses. What may interact with this medicine? This medicine may interact with the following medications: -alcohol -antiviral medicines for HIV or AIDS -atropine -antihistamines for allergy, cough and cold -certain antibiotics like erythromycin, clarithromycin -certain medicines for anxiety or sleep -certain medicines for bladder problems like oxybutynin, tolterodine -certain medicines for depression like amitriptyline, fluoxetine, sertraline -certain medicines for fungal infections like ketoconazole and itraconazole -certain medicines for Parkinson's disease like benztropine, trihexyphenidyl -certain medicines for seizures like carbamazepine, phenobarbital, phenytoin, primidone -certain medicines for stomach problems like dicyclomine, hyoscyamine -certain medicines for travel sickness like scopolamine -general anesthetics like halothane, isoflurane, methoxyflurane, propofol -ipratropium -local anesthetics like lidocaine, pramoxine, tetracaine -MAOIs like Carbex, Eldepryl, Marplan, Nardil, and Parnate -medicines that relax muscles for surgery -other medicines with acetaminophen -other narcotic medicines for pain or cough -phenothiazines like chlorpromazine, mesoridazine, prochlorperazine, thioridazine -rifampin This list may not describe all possible interactions. Give your health care provider a list of all the medicines, herbs, non-prescription drugs, or dietary supplements you use. Also tell them if you smoke, drink alcohol, or use illegal drugs. Some items may interact with your medicine. What should I watch for while using this medicine? Tell your doctor or health care professional if your pain does not go away, if it gets worse, or if you have new or a different type of pain. You may develop tolerance to the medicine.  Tolerance means that you will need a higher dose of the medicine for pain relief. Tolerance is normal and is expected if  you take the medicine for a long time. Do not suddenly stop taking your medicine because you may develop a severe reaction. Your body becomes used to the medicine. This does NOT mean you are addicted. Addiction is a behavior related to getting and using a drug for a non-medical reason. If you have pain, you have a medical reason to take pain medicine. Your doctor will tell you how much medicine to take. If your doctor wants you to stop the medicine, the dose will be slowly lowered over time to avoid any side effects. There are different types of narcotic medicines (opiates). If you take more than one type at the same time or if you are taking another medicine that also causes drowsiness, you may have more side effects. Give your health care provider a list of all medicines you use. Your doctor will tell you how much medicine to take. Do not take more medicine than directed. Call emergency for help if you have problems breathing or unusual sleepiness. Do not take other medicines that contain acetaminophen with this medicine. Always read labels carefully. If you have questions, ask your doctor or pharmacist. If you take too much acetaminophen get medical help right away. Too much acetaminophen can be very dangerous and cause liver damage. Even if you do not have symptoms, it is important to get help right away. You may get drowsy or dizzy. Do not drive, use machinery, or do anything that needs mental alertness until you know how this medicine affects you. Do not stand or sit up quickly, especially if you are an older patient. This reduces the risk of dizzy or fainting spells. Alcohol may interfere with the effect of this medicine. Avoid alcoholic drinks. The medicine will cause constipation. Try to have a bowel movement at least every 2 to 3 days. If you do not have a bowel movement for 3  days, call your doctor or health care professional. Your mouth may get dry. Chewing sugarless gum or sucking hard candy, and drinking plenty of water may help. Contact your doctor if the problem does not go away or is severe. What side effects may I notice from receiving this medicine? Side effects that you should report to your doctor or health care professional as soon as possible: -allergic reactions like skin rash, itching or hives, swelling of the face, lips, or tongue -breathing problems -confusion -redness, blistering, peeling or loosening of the skin, including inside the mouth -signs and symptoms of low blood pressure like dizziness; feeling faint or lightheaded, falls; unusually weak or tired -trouble passing urine or change in the amount of urine -yellowing of the eyes or skin Side effects that usually do not require medical attention (report to your doctor or health care professional if they continue or are bothersome): -constipation -dry mouth -nausea, vomiting -tiredness This list may not describe all possible side effects. Call your doctor for medical advice about side effects. You may report side effects to FDA at 1-800-FDA-1088. Where should I keep my medicine? Keep out of the reach of children. This medicine can be abused. Keep your medicine in a safe place to protect it from theft. Do not share this medicine with anyone. Selling or giving away this medicine is dangerous and against the law. This medicine may cause accidental overdose and death if it taken by other adults, children, or pets. Mix any unused medicine with a substance like cat litter or coffee grounds. Then throw the medicine away in a sealed container like   a sealed bag or a coffee can with a lid. Do not use the medicine after the expiration date. Store at room temperature between 15 and 30 degrees C (59 and 86 degrees F). NOTE: This sheet is a summary. It may not cover all possible information. If you have  questions about this medicine, talk to your doctor, pharmacist, or health care provider.  2017 [employer]vier/Gold Standard (2015-04-21 10:02:16)

## 2016-07-10 NOTE — Progress Notes (Signed)
Safety precautions to be maintained throughout the outpatient stay will include: orient to surroundings, keep bed in low position, maintain call bell within reach at all times, provide assistance with transfer out of bed and ambulation.  

## 2016-08-19 ENCOUNTER — Telehealth: Payer: Self-pay | Admitting: Pain Medicine

## 2016-08-19 NOTE — Telephone Encounter (Signed)
I called patient to set up med refill appt, patient stated she could not take meds given to her and she really didn't want to come back to clinic at this time.

## 2021-10-10 DEATH — deceased

## 2023-10-20 ENCOUNTER — Other Ambulatory Visit: Payer: Self-pay | Admitting: Internal Medicine

## 2023-10-20 DIAGNOSIS — M8580 Other specified disorders of bone density and structure, unspecified site: Secondary | ICD-10-CM
